# Patient Record
Sex: Male | Born: 1960 | Race: Black or African American | Hispanic: No | Marital: Single | Smoking: Current some day smoker
Health system: Southern US, Community
[De-identification: ages and names within clinical notes are randomized; demographics above are authoritative.]

## PROBLEM LIST (undated history)

## (undated) DIAGNOSIS — M1990 Unspecified osteoarthritis, unspecified site: Secondary | ICD-10-CM

## (undated) HISTORY — PX: WRIST SURGERY: SHX841

## (undated) HISTORY — PX: OTHER SURGICAL HISTORY: SHX169

---

## 2000-11-24 ENCOUNTER — Emergency Department (HOSPITAL_COMMUNITY): Admission: EM | Admit: 2000-11-24 | Discharge: 2000-11-24 | Payer: Self-pay | Admitting: Emergency Medicine

## 2005-04-06 ENCOUNTER — Emergency Department (HOSPITAL_COMMUNITY): Admission: EM | Admit: 2005-04-06 | Discharge: 2005-04-06 | Payer: Self-pay | Admitting: Emergency Medicine

## 2008-04-30 ENCOUNTER — Emergency Department (HOSPITAL_COMMUNITY): Admission: EM | Admit: 2008-04-30 | Discharge: 2008-04-30 | Payer: Self-pay | Admitting: Emergency Medicine

## 2008-05-02 ENCOUNTER — Emergency Department (HOSPITAL_COMMUNITY): Admission: EM | Admit: 2008-05-02 | Discharge: 2008-05-02 | Payer: Self-pay | Admitting: Emergency Medicine

## 2008-05-04 ENCOUNTER — Emergency Department (HOSPITAL_COMMUNITY): Admission: EM | Admit: 2008-05-04 | Discharge: 2008-05-04 | Payer: Self-pay | Admitting: Emergency Medicine

## 2008-06-08 ENCOUNTER — Emergency Department (HOSPITAL_COMMUNITY): Admission: EM | Admit: 2008-06-08 | Discharge: 2008-06-08 | Payer: Self-pay | Admitting: Emergency Medicine

## 2009-03-19 ENCOUNTER — Emergency Department (HOSPITAL_COMMUNITY): Admission: EM | Admit: 2009-03-19 | Discharge: 2009-03-19 | Payer: Self-pay | Admitting: Emergency Medicine

## 2010-04-15 ENCOUNTER — Emergency Department (HOSPITAL_COMMUNITY)
Admission: EM | Admit: 2010-04-15 | Discharge: 2010-04-16 | Payer: Self-pay | Source: Home / Self Care | Admitting: Emergency Medicine

## 2011-08-17 ENCOUNTER — Emergency Department (HOSPITAL_COMMUNITY)
Admission: EM | Admit: 2011-08-17 | Discharge: 2011-08-17 | Disposition: A | Discharge status: Home / Self Care | Payer: No Typology Code available for payment source | Attending: Emergency Medicine | Admitting: Emergency Medicine

## 2011-08-17 ENCOUNTER — Encounter (HOSPITAL_COMMUNITY): Payer: Self-pay | Admitting: *Deleted

## 2011-08-17 DIAGNOSIS — M542 Cervicalgia: Secondary | ICD-10-CM | POA: Insufficient documentation

## 2011-08-17 DIAGNOSIS — M549 Dorsalgia, unspecified: Secondary | ICD-10-CM | POA: Insufficient documentation

## 2011-08-17 DIAGNOSIS — Y92410 Unspecified street and highway as the place of occurrence of the external cause: Secondary | ICD-10-CM | POA: Insufficient documentation

## 2011-08-17 MED ORDER — IBUPROFEN 600 MG PO TABS: 600.0000 mg | ORAL_TABLET | Freq: Four times a day (QID) | ORAL | Status: AC | PRN

## 2011-08-17 MED ORDER — DIAZEPAM 5 MG PO TABS: 5.0000 mg | ORAL_TABLET | Freq: Three times a day (TID) | ORAL | Status: AC | PRN
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
## 2011-08-17 NOTE — ED Provider Notes (Signed)
Medical screening examination/treatment/procedure(s) were performed by non-physician practitioner and as supervising physician I was immediately available for consultation/collaboration. Devoria Albe, MD, Armando Gang   Ward Givens, MD 08/17/11 [phone]
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
## 2011-08-31 ENCOUNTER — Emergency Department (HOSPITAL_COMMUNITY)
Admission: EM | Admit: 2011-08-31 | Discharge: 2011-08-31 | Disposition: A | Discharge status: Home / Self Care | Payer: Self-pay | Attending: Emergency Medicine | Admitting: Emergency Medicine

## 2011-08-31 ENCOUNTER — Other Ambulatory Visit: Payer: Self-pay

## 2011-08-31 ENCOUNTER — Encounter (HOSPITAL_COMMUNITY): Payer: Self-pay | Admitting: *Deleted

## 2011-08-31 DIAGNOSIS — R42 Dizziness and giddiness: Secondary | ICD-10-CM | POA: Insufficient documentation

## 2011-08-31 DIAGNOSIS — F1410 Cocaine abuse, uncomplicated: Secondary | ICD-10-CM | POA: Insufficient documentation

## 2011-08-31 DIAGNOSIS — R011 Cardiac murmur, unspecified: Secondary | ICD-10-CM | POA: Insufficient documentation

## 2011-08-31 DIAGNOSIS — F17200 Nicotine dependence, unspecified, uncomplicated: Secondary | ICD-10-CM | POA: Insufficient documentation

## 2011-08-31 LAB — DIFFERENTIAL
Basophils Absolute: 0 10*3/uL (ref 0.0–0.1)
Basophils Relative: 0 % (ref 0–1)
Eosinophils Absolute: 0.4 10*3/uL (ref 0.0–0.7)
Eosinophils Relative: 2 % (ref 0–5)
Lymphs Abs: 1.3 10*3/uL (ref 0.7–4.0)
Monocytes Relative: 5 % (ref 3–12)
Neutro Abs: 13.7 10*3/uL — ABNORMAL HIGH (ref 1.7–7.7)
Neutrophils Relative %: 85 % — ABNORMAL HIGH (ref 43–77)

## 2011-08-31 LAB — BASIC METABOLIC PANEL
BUN: 17 mg/dL (ref 6–23)
CO2: 22 mEq/L (ref 19–32)
Calcium: 9.3 mg/dL (ref 8.4–10.5)
Chloride: 100 mEq/L (ref 96–112)
GFR calc Af Amer: 71 mL/min — ABNORMAL LOW (ref >90)
GFR calc non Af Amer: 61 mL/min — ABNORMAL LOW (ref >90)
Glucose, Bld: 101 mg/dL — ABNORMAL HIGH (ref 70–99)
Potassium: 3.8 mEq/L (ref 3.5–5.1)
Sodium: 136 mEq/L (ref 135–145)

## 2011-08-31 LAB — URINALYSIS, ROUTINE W REFLEX MICROSCOPIC
Bilirubin Urine: NEGATIVE
Ketones, ur: NEGATIVE mg/dL
Leukocytes, UA: NEGATIVE
Nitrite: NEGATIVE
Specific Gravity, Urine: >1.03 — ABNORMAL HIGH (ref 1.005–1.030)
Urobilinogen, UA: 0.2 mg/dL (ref 0.0–1.0)
pH: 6 (ref 5.0–8.0)

## 2011-08-31 LAB — RAPID URINE DRUG SCREEN, HOSP PERFORMED
Barbiturates: NOT DETECTED
Benzodiazepines: POSITIVE — AB
Cocaine: POSITIVE — AB
Opiates: NOT DETECTED
Tetrahydrocannabinol: NOT DETECTED

## 2011-08-31 LAB — CBC
Hemoglobin: 14.4 g/dL (ref 13.0–17.0)
MCH: 32.3 pg (ref 26.0–34.0)
MCHC: 35.6 g/dL (ref 30.0–36.0)
Platelets: 298 10*3/uL (ref 150–400)
RBC: 4.46 MIL/uL (ref 4.22–5.81)
RDW: 13.3 % (ref 11.5–15.5)

## 2011-08-31 LAB — URINE MICROSCOPIC-ADD ON

## 2011-08-31 LAB — ETHANOL: Alcohol, Ethyl (B): <11 mg/dL (ref 0–11)

## 2011-08-31 NOTE — ED Notes (Signed)
Discharge instructions reviewed with pt, questions answered. Pt verbalized understanding.  
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
## 2011-08-31 NOTE — Discharge Instructions (Signed)
Avoid using cocaine. Get plenty of rest, drink a lot of fluids, and find a primary care Dr. to see for followup care. Use the list of treatment programs given to you tonight to help find a place to get help for your cocaine abuse. Return here if needed for problems.  RESOURCE GUIDE  Dental Problems  Patients with Medicaid: [facility] [phone] [address]                                           [phone] [employer] Phone:  [phone]                                                  Phone:  [phone]  If unable to pay or uninsured, contact:  [facility] or [facility]. to become qualified for the adult dental clinic.  Chronic Pain Problems Contact [facility]  [phone] Patients need to be referred by their primary care doctor.  Insufficient Money for Medicine Contact United Way:  call "211" or [facility] 910-304-9115.  No Primary Care Doctor Call [facility]  309-405-1567 Other agencies that provide inexpensive medical care    [facility]  [phone]    [facility]  [phone]    [facility]  [phone]    [facility]  [phone]    [facility]  [phone]    [facility] Clinic  [phone]  Psychological Services [facility] Behavioral Health  [phone] [facility] Services  [phone] [facility] Mental Health   [phone] (emergency services [phone])  Substance Abuse Resources Alcohol and Drug Services  309-279-5939 [facility] [phone] [facility] [phone] Floydene Flock [phone] Residential & Outpatient Substance Abuse Program  (425) [phone]  Abuse/Neglect [facility] Child Abuse Hotline (848) 700-2381 [facility] Child Abuse Hotline 313-214-2224 (After Hours)  Emergency Shelter [facility] [phone]  [facility] [place] [facility] 661-055-8159 [facility] [phone]  MRSA Hotline #:   684-521-8391    [facility] Resources  [facility] [place]     United Way                          [facility] [address] [place]                       [address]      [address]                                                  Cristobal Goldmann Phone:  [phone]  Phone:  [phone]                 Phone:  [phone]  [facility] Mental Health Phone:  [phone]  [facility] Child Abuse Hotline [phone] 9348291790 (After Hours) Cocaine Abuse PROBLEMS FROM USING COCAINE:   Highly addictive.   Illegal.   Risk of sudden death.   Heart disease.   Irregular heart beat.   High blood pressure.   Damage to nose and lungs.   Severe agitation.   Hallucinations.   Violent behavior.   Paranoia.   Sexual dysfunction.  Most cocaine users deny that they have a problem with addiction. The biggest problem is admitting that you are dependent on cocaine. Those trying to quit using it may experience depression and withdrawal symptoms. Other withdrawal symptoms include fatigue, suicidal thoughts, sleepiness, restlessness, anxiety, and increased craving for cocaine. There are medications available to help prevent depression associated with stopping cocaine. Most users will find a support group or treatment program helpful in coming off and staying off cocaine. The best chance to cure cocaine addiction is to go into group therapy and to be in a drug-free environment. It is very important to develop healthy relationships and avoid socializing with people who use or deal drugs. Eat well, and give your body the proper rest and healthy exercise it needs. You may need medication to help treat withdrawal symptoms. Call your caregiver or a drug treatment center for more help.  You may also want to call  the [facility] on Drug Abuse at 800-662-HELP in the [place] SEEK IMMEDIATE MEDICAL CARE IF:  You develop severe chest pain.   You develop shortness of breath.   You develop extreme agitation.  Document Released: 07/21/2004 Document Revised: 06/02/2011 Document Reviewed: 04/15/2009 [facility] Patient Information 2012 [place], [place].Heart Murmur A heart murmur is an extra sound heard by your caregiver when listening to your heart with a device called a stethoscope. The sound might be a "hum" or "whoosh" sound heard when the heart beats. The sound comes from turbulence when blood flows through the heart. There are two types of heart murmurs:  Innocent (Harmless) murmurs: Most people with this type of heart murmur do not have signs or symptoms of heart problems. Many children have innocent heart murmurs. When an innocent heart murmur is found, there is no need to get tests or do treatment. Also, there is no need to restrict activities or stop playing sports. Innocent heart murmurs may be caused by many things. For example, it might be caused by a tiny hole or defect in the wall of the heart. These defects often close as a child grows. An innocent heart murmur may be heard by an examining clinician throughout your life. If you see a new caregiver, please let him or her know this was found during past exams.   Abnormal murmurs: May have signs and symptoms of heart problems. These types of murmurs can occur in children and adults. In children, abnormal heart murmurs are typically caused from heart defects that are present at birth. In adults, abnormal murmurs are usually from heart valve problems caused by disease, infection, or aging.  SYMPTOMS   Innocent (Harmless) murmurs do not cause symptoms or require you to limit physical activity.   Many people with abnormal murmurs may or may not have symptoms. If symptoms do develop, they might include:   Shortness of breath.   Blue coloring of the  skin, especially on the fingertips.  Chest pain.   Palpitations or feeling a "fluttering" or a "skipped" heart beat.   Fainting.   Persistent cough.   Getting tired much faster than expected.  DIAGNOSIS  A heart murmur might be heard during a pre-sports physical or during any type of examination. When a murmur is heard, it may suggest a possible problem. When this happens, your caregiver may ask you to see a heart specialist (cardiologist). You may also be asked to undergo one or more heart tests. In these cases, testing may vary depending upon what your caregiver heard. Tests for a heart murmur might include one or more of the following:  EKG (electrocardiogram).   Echocardiogram.   Cardiac MRI.  For children and adults who have an abnormal heart murmur and want to play sports, it is important to complete testing, review test results, and receive recommendations from your caregiver. If heart disease is present, it may be risky to play. Finding out the results of your test Not all test results are available during your visit. If your test results are not back during the visit, make an appointment with your caregiver to find out the results. Do not assume everything is normal if you have not heard from your caregiver or the medical facility. It is important for you to follow up on all of your test results.  TREATMENT  As noted above, innocent (harmless) murmurs require no treatment or activity restriction. If the murmur represents a problem with the heart, treatment will depend upon the exact nature of the problem. In these cases, medicine or surgery may be needed to treat the problem. HOME CARE INSTRUCTIONS If you want to participate in sports or other types of strenuous physical activity, it is important to discuss this first with your caregiver. If the murmur represents a problem with the heart and you choose to participate in sports, there is a small chance that a serious problem  (including sudden death) could result.  SEEK MEDICAL CARE IF:   You feel that your symptoms are slowly worsening.   You develop any new symptoms that cause concern.   You feel that you are having side effects from any medicines prescribed.  SEEK IMMEDIATE MEDICAL CARE IF:   Chest pain develops.   You are short of breath.   You notice that your heart beats irregularly often enough to cause you to worry.   You have fainting spells.   There is a worsening of any problems that brought you or your child in for medical care.  Document Released: 07/21/2004 Document Revised: 06/02/2011 Document Reviewed: 08/21/2007 [facility] Patient Information 2012 [place], [place].
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
## 2011-08-31 NOTE — ED Notes (Addendum)
Pt belongings bagged, labeled & placed in ems equipment room locker

## 2011-08-31 NOTE — ED Notes (Signed)
Pt's belongings returned, pt verified contents. Pt transported home by [employer] officer.

## 2012-01-10 ENCOUNTER — Encounter (HOSPITAL_COMMUNITY): Payer: Self-pay

## 2012-01-10 ENCOUNTER — Emergency Department (HOSPITAL_COMMUNITY)
Admission: EM | Admit: 2012-01-10 | Discharge: 2012-01-10 | Disposition: A | Discharge status: Home / Self Care | Payer: Self-pay | Attending: Emergency Medicine | Admitting: Emergency Medicine

## 2012-01-10 ENCOUNTER — Emergency Department (HOSPITAL_COMMUNITY): Payer: Self-pay

## 2012-01-10 DIAGNOSIS — M25569 Pain in unspecified knee: Secondary | ICD-10-CM | POA: Insufficient documentation

## 2012-01-10 DIAGNOSIS — M25519 Pain in unspecified shoulder: Secondary | ICD-10-CM | POA: Insufficient documentation

## 2012-01-10 MED ORDER — TRAMADOL HCL 50 MG PO TABS: 50.0000 mg | ORAL_TABLET | Freq: Four times a day (QID) | ORAL | Status: AC | PRN

## 2012-01-10 MED ORDER — HYDROCODONE-ACETAMINOPHEN 5-325 MG PO TABS
ORAL_TABLET | ORAL | Status: AC
  Administered 2012-01-10: 1 via ORAL
  Filled 2012-01-10: qty 1

## 2012-01-10 MED ORDER — NAPROXEN 500 MG PO TABS: 500.0000 mg | ORAL_TABLET | Freq: Two times a day (BID) | ORAL | Status: DC

## 2012-01-10 MED ORDER — HYDROCODONE-ACETAMINOPHEN 5-325 MG PO TABS
1.0000 | ORAL_TABLET | Freq: Once | ORAL | Status: AC
  Administered 2012-01-10: 1 via ORAL

## 2012-01-10 NOTE — ED Notes (Signed)
Pt c/o pain in r shoulder and r elbow x 3 days.  Denies injury.  Pain is worse with movement.  Also reports for the past 2 weeks R knee has been hurting and "pops" when bends leg.  Says knee has been "giving out."
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
## 2012-01-10 NOTE — ED Notes (Signed)
Patient with no complaints at this time. Respirations even and unlabored. Skin warm/dry. Discharge instructions reviewed with patient at this time. Patient given opportunity to voice concerns/ask questions. Patient discharged at this time and left Emergency Department with steady gait.   

## 2012-01-10 NOTE — Progress Notes (Signed)
Requested medication assistance with pain medication. Notified ER that the Medication Indigent fund ended 12/25/11 and was for medication such as antibiotics. This medication would have to be approved by their Department Director.

## 2012-01-10 NOTE — ED Provider Notes (Signed)
History    This chart was scribed for George Lennert, MD, MD by George Beltran. The patient was seen in room APFT20 and the patient's care was started at 12:56PM.   CSN: [id number]  Arrival date & time 01/10/12  1157   First MD Initiated Contact with Patient 01/10/12 1253      Chief Complaint  Patient presents with  . Shoulder Pain  . Elbow Pain  . Knee Pain    (Consider location/radiation/quality/duration/timing/severity/associated sxs/prior treatment) Patient is a 51 y.o. male presenting with shoulder pain and knee pain. The history is provided by the patient.  Shoulder Pain This is a new problem. The current episode started more than 2 days ago. The problem occurs constantly. The problem has not changed since onset. Knee Pain This is a new problem. The current episode started more than 2 days ago. The problem occurs constantly. The problem has not changed since onset.The symptoms are aggravated by walking.   George Beltran is a 51 y.o. male who presents to the Emergency Department complaining of moderate right shoulder, right elbow and right knee pain onset 3 days ago. Pt reports that symptoms have been constant since onset without radiation. The pain has been waking the patient up . Pain in shoulder is aggravated by movement. He reports movement of his leg aggravates the right knee pain.   History reviewed. No pertinent past medical history.  Past Surgical History  Procedure Date  . Wrist surgery     No family history on file.  History  Substance Use Topics  . Smoking status: Current Some Day Smoker -- 0.5 packs/day    Types: Cigarettes  . Smokeless tobacco: Not on file  . Alcohol Use: Yes     occ      Review of Systems  All other systems reviewed and are negative.   10 Systems reviewed and all are negative for acute change except as noted in the HPI.   Allergies  Review of patient's allergies indicates no known allergies.  Home Medications   Current  Outpatient Rx  Name Route Sig Dispense Refill  . B COMPLEX-C PO TABS Oral Take 1 tablet by mouth daily.    Marland Kitchen DIAZEPAM 5 MG PO TABS Oral Take 5 mg by mouth every 6 (six) hours as needed.    . ADULT MULTIVITAMIN W/MINERALS CH Oral Take 1 tablet by mouth daily.    Marland Kitchen PREDNISONE 10 MG PO TABS Oral Take 10 mg by mouth daily.      BP 109/91  Pulse 65  Temp 97.9 F (36.6 C) (Oral)  Resp 18  Ht 5\' 10"  (1.778 m)  Wt 180 lb (81.647 kg)  BMI 25.83 kg/m2  SpO2 100%  Physical Exam  Nursing note and vitals reviewed. Constitutional: He is oriented to person, place, and time. He appears well-developed.  HENT:  Head: Normocephalic and atraumatic.  Eyes: Conjunctivae and EOM are normal. No scleral icterus.  Neck: Neck supple. No thyromegaly present.  Cardiovascular: Normal rate and regular rhythm.  Exam reveals no gallop and no friction rub.   No murmur heard. Pulmonary/Chest: No stridor. He has no wheezes. He has no rales. He exhibits no tenderness.  Abdominal: He exhibits no distension. There is no tenderness. There is no rebound.  Musculoskeletal: Normal range of motion. He exhibits no edema.       Anterior right shoulder tenderness with pain with extension.  Left lateral knee tenderness. Full ROM of left knee.   Lymphadenopathy:  He has no cervical adenopathy.  Neurological: He is oriented to person, place, and time. Coordination normal.  Skin: No rash noted. No erythema.  Psychiatric: He has a normal mood and affect. His behavior is normal.    ED Course  Procedures (including critical care time) DIAGNOSTIC STUDIES: Oxygen Saturation is 100% on room air, normal by my interpretation.    COORDINATION OF CARE: 1:00PM EDP discusses pt ED treatment with pt     Labs Reviewed - No data to display Dg Shoulder Right  01/10/2012  *RADIOLOGY REPORT*  Clinical Data: Right shoulder pain since a fall 1 week ago.  RIGHT SHOULDER - 2+ VIEW  Comparison: None.  Findings: There is no fracture,  dislocation, or other acute osseous abnormality.  Slight degenerative spurring on the humeral head.  IMPRESSION: No acute abnormalities.  Original Report Authenticated By: George Beltran, M.D.     No diagnosis found.    MDM  The chart was scribed for me under my direct supervision.  I personally performed the history, physical, and medical decision making and all procedures in the evaluation of this patient.George Lennert, MD 01/10/12 [phone]

## 2012-01-17 ENCOUNTER — Emergency Department (HOSPITAL_COMMUNITY)
Admission: EM | Admit: 2012-01-17 | Discharge: 2012-01-17 | Disposition: A | Discharge status: Home / Self Care | Payer: Self-pay | Attending: Emergency Medicine | Admitting: Emergency Medicine

## 2012-01-17 ENCOUNTER — Encounter (HOSPITAL_COMMUNITY): Payer: Self-pay

## 2012-01-17 DIAGNOSIS — F17200 Nicotine dependence, unspecified, uncomplicated: Secondary | ICD-10-CM | POA: Insufficient documentation

## 2012-01-17 DIAGNOSIS — M25511 Pain in right shoulder: Secondary | ICD-10-CM

## 2012-01-17 DIAGNOSIS — M7710 Lateral epicondylitis, unspecified elbow: Secondary | ICD-10-CM

## 2012-01-17 DIAGNOSIS — M25519 Pain in unspecified shoulder: Secondary | ICD-10-CM | POA: Insufficient documentation

## 2012-01-17 MED ORDER — HYDROCODONE-ACETAMINOPHEN 5-325 MG PO TABS: 1.0000 | ORAL_TABLET | Freq: Four times a day (QID) | ORAL | Status: AC | PRN

## 2012-01-17 MED ORDER — IBUPROFEN 600 MG PO TABS: 600.0000 mg | ORAL_TABLET | Freq: Three times a day (TID) | ORAL | Status: AC | PRN

## 2012-01-17 NOTE — ED Provider Notes (Signed)
History   This chart was scribed for Lyanne Co, MD by Charolett Bumpers . The patient was seen in room APFT22/APFT22. Patient's care was started at 16:03.    CSN: [id number]  Arrival date & time 01/17/12  1538   First MD Initiated Contact with Patient 01/17/12 1603      Chief Complaint  Patient presents with  . Shoulder Pain     HPI George Beltran is a 51 y.o. male who presents to the Emergency Department complaining of constant, moderate right shoulder pain for the past 3-4 weeks. Pt states that he was seen here in ED with a normal x-ray and was told to f/u with an orthopedic surgeon. Pt states that he did f/u with Dr. Gurney Maxin and has an appointment in 2 weeks. Pt reports that his shoulder pain has slightly worsened but denies any new pain. Pt reports that the pain medication he was d/c with did provide relief, but ran out of the pain medication 2 days ago.    History reviewed. No pertinent past medical history.  Past Surgical History  Procedure Date  . Wrist surgery     No family history on file.  History  Substance Use Topics  . Smoking status: Current Some Day Smoker -- 0.5 packs/day    Types: Cigarettes  . Smokeless tobacco: Not on file  . Alcohol Use: Yes     occ      Review of Systems A complete 10 system review of systems was obtained and all systems are negative except as noted in the HPI and PMH.   Allergies  Review of patient's allergies indicates no known allergies.  Home Medications   Current Outpatient Rx  Name Route Sig Dispense Refill  . B COMPLEX-C PO TABS Oral Take 1 tablet by mouth daily.    Marland Kitchen DIAZEPAM 5 MG PO TABS Oral Take 5 mg by mouth every 6 (six) hours as needed.    . ADULT MULTIVITAMIN W/MINERALS CH Oral Take 1 tablet by mouth daily.    Marland Kitchen NAPROXEN 500 MG PO TABS Oral Take 1 tablet (500 mg total) by mouth 2 (two) times daily. 30 tablet 0  . PREDNISONE 10 MG PO TABS Oral Take 10 mg by mouth daily.    . TRAMADOL HCL 50 MG PO  TABS Oral Take 1 tablet (50 mg total) by mouth every 6 (six) hours as needed for pain. 30 tablet 0    BP 143/85  Pulse 67  Temp 97.9 F (36.6 C) (Oral)  Resp 18  Ht 5\' 10"  (1.778 m)  Wt 180 lb (81.647 kg)  BMI 25.83 kg/m2  SpO2 99%  Physical Exam  Nursing note and vitals reviewed. Constitutional: He is oriented to person, place, and time. He appears well-developed and well-nourished. No distress.  HENT:  Head: Normocephalic and atraumatic.  Eyes: EOM are normal. Pupils are equal, round, and reactive to light.  Neck: Normal range of motion. Neck supple. No tracheal deviation present.  Cardiovascular: Normal rate and intact distal pulses.        Normal right radial pulse.   Pulmonary/Chest: Effort normal. No respiratory distress.  Abdominal: Soft. He exhibits no distension.  Musculoskeletal: Normal range of motion. He exhibits tenderness. He exhibits no edema.       Tenderness of right radial head with no tenderness of right shoulder.   Neurological: He is alert and oriented to person, place, and time. No sensory deficit.       Strength 5/5  bilaterally of upper extremities.     Skin: Skin is warm and dry.  Psychiatric: He has a normal mood and affect. His behavior is normal.    ED Course  Procedures (including critical care time)  DIAGNOSTIC STUDIES: Oxygen Saturation is 99% on room air, normal by my interpretation.    COORDINATION OF CARE:  16:09-Discussed planned course of treatment with the patient, who is agreeable at this time.    I personally reviewed the imaging tests through PACS system  I reviewed available ER/hospitalization records thought the EMR   Labs Reviewed - No data to display No results found.   No diagnosis found.    MDM  I suspect this is right lateral epicondylitis.  He is tenderness over his right radial head.  Patient be treated with anti-inflammatory pain medicine and a short course of Vicodin.  Pincus Badder he has followup with an orthopedic  surgeon on 02/02/2012.  There is no indication for repeat imaging.  He has no weakness in his right hand.  Normal right radial pulse.  His compartments are soft.  I personally performed the services described in this documentation, which was scribed in my presence. The recorded information has been reviewed and considered.          Lyanne Co, MD 01/17/12 [phone]

## 2012-01-17 NOTE — ED Notes (Signed)
Pt reports has had R shoulder pain for the past 3 or 4 weeks.  Reports was evaluated here last week and given pain medication.  Pt ran out of meds 2 days ago.

## 2012-01-17 NOTE — ED Notes (Signed)
Pt c/o rt shoulder pain x 3 weeks that has gotten gradually worse over time. Pt denies any injury.

## 2012-02-12 ENCOUNTER — Emergency Department (HOSPITAL_COMMUNITY)
Admission: EM | Admit: 2012-02-12 | Discharge: 2012-02-12 | Disposition: A | Discharge status: Home / Self Care | Payer: Self-pay | Attending: Emergency Medicine | Admitting: Emergency Medicine

## 2012-02-12 ENCOUNTER — Encounter (HOSPITAL_COMMUNITY): Payer: Self-pay | Admitting: *Deleted

## 2012-02-12 DIAGNOSIS — R52 Pain, unspecified: Secondary | ICD-10-CM | POA: Insufficient documentation

## 2012-02-12 DIAGNOSIS — M2550 Pain in unspecified joint: Secondary | ICD-10-CM | POA: Insufficient documentation

## 2012-02-12 DIAGNOSIS — F17200 Nicotine dependence, unspecified, uncomplicated: Secondary | ICD-10-CM | POA: Insufficient documentation

## 2012-02-12 DIAGNOSIS — G8929 Other chronic pain: Secondary | ICD-10-CM | POA: Insufficient documentation

## 2012-02-12 MED ORDER — HYDROCODONE-ACETAMINOPHEN 5-325 MG PO TABS
2.0000 | ORAL_TABLET | Freq: Once | ORAL | Status: AC
  Administered 2012-02-12: 2 via ORAL
  Filled 2012-02-12: qty 2

## 2012-02-12 MED ORDER — HYDROCODONE-ACETAMINOPHEN 5-325 MG PO TABS: ORAL_TABLET | ORAL | Status: DC

## 2012-02-12 MED ORDER — DEXAMETHASONE 4 MG PO TABS: ORAL_TABLET | ORAL | Status: AC

## 2012-02-12 MED ORDER — DEXAMETHASONE SODIUM PHOSPHATE 4 MG/ML IJ SOLN
8.0000 mg | Freq: Once | INTRAMUSCULAR | Status: AC
  Administered 2012-02-12: 8 mg via INTRAMUSCULAR
  Filled 2012-02-12: qty 2

## 2012-02-12 MED ORDER — ONDANSETRON HCL 4 MG PO TABS
4.0000 mg | ORAL_TABLET | Freq: Once | ORAL | Status: AC
  Administered 2012-02-12: 4 mg via ORAL
  Filled 2012-02-12: qty 1

## 2012-02-12 NOTE — ED Provider Notes (Signed)
Medical screening examination/treatment/procedure(s) were performed by non-physician practitioner and as supervising physician I was immediately available for consultation/collaboration. Devoria Albe, MD, FACEPWard Givens, MD 02/12/12 [phone]

## 2012-02-12 NOTE — ED Notes (Signed)
Pt c/o pain in his left arm for 25 years, right arm for 2 years and right knee for 2 years. States that he has constant pain every day. Right knee swollen on top.

## 2012-02-12 NOTE — ED Provider Notes (Signed)
History     CSN: [id number]  Arrival date & time 02/12/12  1015   First MD Initiated Contact with Patient 02/12/12 1029      Chief Complaint  Patient presents with  . Arm Pain    (Consider location/radiation/quality/duration/timing/severity/associated sxs/prior treatment) Patient is a 51 y.o. male presenting with arm pain. The history is provided by the patient.  Arm Pain This is a chronic problem. The current episode started more than 1 year ago. The problem occurs daily. The problem has been gradually worsening. Associated symptoms include arthralgias and joint swelling. Pertinent negatives include no abdominal pain, chest pain, coughing, fever or neck pain. Exacerbated by: certain positions. He has tried nothing for the symptoms. The treatment provided no relief.    History reviewed. No pertinent past medical history.  Past Surgical History  Procedure Date  . Wrist surgery   . Arm surgery     left    History reviewed. No pertinent family history.  History  Substance Use Topics  . Smoking status: Current Some Day Smoker -- 0.5 packs/day    Types: Cigarettes  . Smokeless tobacco: Not on file  . Alcohol Use: Yes     occ      Review of Systems  Constitutional: Negative for fever and activity change.       All ROS Neg except as noted in HPI  HENT: Negative for nosebleeds and neck pain.   Eyes: Negative for photophobia and discharge.  Respiratory: Negative for cough, shortness of breath and wheezing.   Cardiovascular: Negative for chest pain and palpitations.  Gastrointestinal: Negative for abdominal pain and blood in stool.  Genitourinary: Negative for dysuria, frequency and hematuria.  Musculoskeletal: Positive for joint swelling and arthralgias. Negative for back pain.  Skin: Negative.   Neurological: Negative for dizziness, seizures and speech difficulty.  Psychiatric/Behavioral: Negative for hallucinations and confusion.    Allergies  Review of patient's  allergies indicates no known allergies.  Home Medications   Current Outpatient Rx  Name Route Sig Dispense Refill  . B COMPLEX-C PO TABS Oral Take 1 tablet by mouth daily.    Marland Kitchen DIAZEPAM 5 MG PO TABS Oral Take 5 mg by mouth every 6 (six) hours as needed.    . ADULT MULTIVITAMIN W/MINERALS CH Oral Take 1 tablet by mouth daily.    Marland Kitchen NAPROXEN 500 MG PO TABS Oral Take 1 tablet (500 mg total) by mouth 2 (two) times daily. 30 tablet 0  . PREDNISONE 10 MG PO TABS Oral Take 10 mg by mouth daily.      BP 133/86  Pulse 72  Temp 98.3 F (36.8 C) (Oral)  Resp 16  Ht 5\' 10"  (1.778 m)  Wt 180 lb (81.647 kg)  BMI 25.83 kg/m2  SpO2 100%  Physical Exam  Nursing note and vitals reviewed. Constitutional: He is oriented to person, place, and time. He appears well-developed and well-nourished.  Non-toxic appearance.  HENT:  Head: Normocephalic.  Right Ear: Tympanic membrane and external ear normal.  Left Ear: Tympanic membrane and external ear normal.  Eyes: EOM and lids are normal. Pupils are equal, round, and reactive to light.  Neck: Normal range of motion. Neck supple. Carotid bruit is not present.  Cardiovascular: Normal rate, regular rhythm, normal heart sounds, intact distal pulses and normal pulses.   Pulmonary/Chest: Breath sounds normal. No respiratory distress.  Abdominal: Soft. Bowel sounds are normal. There is no tenderness. There is no guarding.  Musculoskeletal: Normal range of motion.  There is crepitus with range of motion of both shoulders. There is bursa area tenderness on the right shoulder. There no hot joints appreciated. There's no dislocations appreciated. The elbows are well within normal limits. There is soreness with range of motion of the left wrist and fingers.  There is fair range of motion of both hips. There is significant crepitus with attempted range of motion of both knees. Right more than left. There is degenerative deformity of the anterior right knee. There  is fair range of motion of both ankles. The dorsalis pedis pulses are symmetrical.  Lymphadenopathy:       Head (right side): No submandibular adenopathy present.       Head (left side): No submandibular adenopathy present.    He has no cervical adenopathy.  Neurological: He is alert and oriented to person, place, and time. He has normal strength. No cranial nerve deficit or sensory deficit. He exhibits normal muscle tone. Coordination normal.  Skin: Skin is warm and dry.  Psychiatric: He has a normal mood and affect. His speech is normal.    ED Course  Procedures (including critical care time)  Labs Reviewed - No data to display No results found.   No diagnosis found.    MDM  I have reviewed nursing notes, vital signs, and all appropriate lab and imaging results for this patient. Patient presents with an acute flare of his chronic multiple area joint pain. No acute findings at this time. Patient is treated with dexamethasone daily and Norco every 4 hours. Patient advised to see M.D. At the health department to establish primary care physician and for continued management of his chronic pain.       Kathie Dike, [place] 02/12/12 1104

## 2012-03-14 ENCOUNTER — Encounter (HOSPITAL_COMMUNITY): Payer: Self-pay | Admitting: Emergency Medicine

## 2012-03-14 ENCOUNTER — Emergency Department (HOSPITAL_COMMUNITY)
Admission: EM | Admit: 2012-03-14 | Discharge: 2012-03-14 | Disposition: A | Discharge status: Home / Self Care | Payer: Self-pay | Attending: Emergency Medicine | Admitting: Emergency Medicine

## 2012-03-14 DIAGNOSIS — M25559 Pain in unspecified hip: Secondary | ICD-10-CM | POA: Insufficient documentation

## 2012-03-14 DIAGNOSIS — M25519 Pain in unspecified shoulder: Secondary | ICD-10-CM | POA: Insufficient documentation

## 2012-03-14 DIAGNOSIS — F17200 Nicotine dependence, unspecified, uncomplicated: Secondary | ICD-10-CM | POA: Insufficient documentation

## 2012-03-14 DIAGNOSIS — Z87828 Personal history of other (healed) physical injury and trauma: Secondary | ICD-10-CM | POA: Insufficient documentation

## 2012-03-14 DIAGNOSIS — G8929 Other chronic pain: Secondary | ICD-10-CM

## 2012-03-14 DIAGNOSIS — M542 Cervicalgia: Secondary | ICD-10-CM | POA: Insufficient documentation

## 2012-03-14 MED ORDER — TRAMADOL HCL 50 MG PO TABS
100.0000 mg | ORAL_TABLET | Freq: Once | ORAL | Status: AC
  Administered 2012-03-14: 100 mg via ORAL
  Filled 2012-03-14: qty 2

## 2012-03-14 MED ORDER — ONDANSETRON HCL 4 MG PO TABS
4.0000 mg | ORAL_TABLET | Freq: Once | ORAL | Status: AC
  Administered 2012-03-14: 4 mg via ORAL
  Filled 2012-03-14: qty 1

## 2012-03-14 MED ORDER — TRAMADOL HCL 50 MG PO TABS: 50.0000 mg | ORAL_TABLET | Freq: Four times a day (QID) | ORAL | Status: DC | PRN

## 2012-03-14 MED ORDER — DEXAMETHASONE SODIUM PHOSPHATE 4 MG/ML IJ SOLN
8.0000 mg | Freq: Once | INTRAMUSCULAR | Status: AC
  Administered 2012-03-14: 8 mg via INTRAMUSCULAR
  Filled 2012-03-14: qty 2

## 2012-03-14 MED ORDER — DEXAMETHASONE 4 MG PO TABS: ORAL_TABLET | ORAL | Status: DC

## 2012-03-14 NOTE — ED Notes (Signed)
Pt presents with chronic arthritic type pain x several months that has worsened over the past few days. Pain is c/o in shoulder, knee and lower back. Denies injury. Pt noted pacing in room.
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
## 2012-03-20 NOTE — ED Provider Notes (Signed)
Medical screening examination/treatment/procedure(s) were performed by non-physician practitioner and as supervising physician I was immediately available for consultation/collaboration.   [name], MD 03/20/12 [phone]

## 2013-09-02 ENCOUNTER — Emergency Department (HOSPITAL_COMMUNITY)
Admission: EM | Admit: 2013-09-02 | Discharge: 2013-09-02 | Disposition: A | Discharge status: Home / Self Care | Payer: No Typology Code available for payment source | Attending: Emergency Medicine | Admitting: Emergency Medicine

## 2013-09-02 ENCOUNTER — Encounter (HOSPITAL_COMMUNITY): Payer: Self-pay | Admitting: Emergency Medicine

## 2013-09-02 DIAGNOSIS — M129 Arthropathy, unspecified: Secondary | ICD-10-CM | POA: Insufficient documentation

## 2013-09-02 DIAGNOSIS — M549 Dorsalgia, unspecified: Secondary | ICD-10-CM

## 2013-09-02 DIAGNOSIS — M545 Low back pain, unspecified: Secondary | ICD-10-CM | POA: Insufficient documentation

## 2013-09-02 DIAGNOSIS — F17200 Nicotine dependence, unspecified, uncomplicated: Secondary | ICD-10-CM | POA: Insufficient documentation

## 2013-09-02 DIAGNOSIS — M25562 Pain in left knee: Secondary | ICD-10-CM

## 2013-09-02 DIAGNOSIS — M25569 Pain in unspecified knee: Secondary | ICD-10-CM | POA: Insufficient documentation

## 2013-09-02 HISTORY — DX: Unspecified osteoarthritis, unspecified site: M19.90

## 2013-09-02 MED ORDER — DIAZEPAM 5 MG PO TABS: 5.0000 mg | ORAL_TABLET | Freq: Three times a day (TID) | ORAL | Status: DC | PRN

## 2013-09-02 MED ORDER — KETOROLAC TROMETHAMINE 60 MG/2ML IM SOLN
60.0000 mg | Freq: Once | INTRAMUSCULAR | Status: AC
  Administered 2013-09-02: 60 mg via INTRAMUSCULAR
  Filled 2013-09-02: qty 2

## 2013-09-02 MED ORDER — HYDROCODONE-ACETAMINOPHEN 5-325 MG PO TABS
1.0000 | ORAL_TABLET | ORAL | Status: DC | PRN
Start: 2013-09-02 — End: 2013-12-24

## 2013-09-02 NOTE — Discharge Instructions (Signed)
Arthralgia °Your caregiver has diagnosed you as suffering from an arthralgia. Arthralgia means there is pain in a joint. This can come from many reasons including: °· Bruising the joint which causes soreness (inflammation) in the joint. °· Wear and tear on the joints which occur as we grow older (osteoarthritis). °· Overusing the joint. °· Various forms of arthritis. °· Infections of the joint. °Regardless of the cause of pain in your joint, most of these different pains respond to anti-inflammatory drugs and rest. The exception to this is when a joint is infected, and these cases are treated with antibiotics, if it is a bacterial infection. °HOME CARE INSTRUCTIONS  °· Rest the injured area for as long as directed by your caregiver. Then slowly start using the joint as directed by your caregiver and as the pain allows. Crutches as directed may be useful if the ankles, knees or hips are involved. If the knee was splinted or casted, continue use and care as directed. If an stretchy or elastic wrapping bandage has been applied today, it should be removed and re-applied every 3 to 4 hours. It should not be applied tightly, but firmly enough to keep swelling down. Watch toes and feet for swelling, bluish discoloration, coldness, numbness or excessive pain. If any of these problems (symptoms) occur, remove the ace bandage and re-apply more loosely. If these symptoms persist, contact your caregiver or return to this location. °· For the first 24 hours, keep the injured extremity elevated on pillows while lying down. °· Apply ice for 15-20 minutes to the sore joint every couple hours while awake for the first half day. Then 03-04 times per day for the first 48 hours. Put the ice in a plastic bag and place a towel between the bag of ice and your skin. °· Wear any splinting, casting, elastic bandage applications, or slings as instructed. °· Only take over-the-counter or prescription medicines for pain, discomfort, or fever as  directed by your caregiver. Do not use aspirin immediately after the injury unless instructed by your physician. Aspirin can cause increased bleeding and bruising of the tissues. °· If you were given crutches, continue to use them as instructed and do not resume weight bearing on the sore joint until instructed. °Persistent pain and inability to use the sore joint as directed for more than 2 to 3 days are warning signs indicating that you should see a caregiver for a follow-up visit as soon as possible. Initially, a hairline fracture (break in bone) may not be evident on X-rays. Persistent pain and swelling indicate that further evaluation, non-weight bearing or use of the joint (use of crutches or slings as instructed), or further X-rays are indicated. X-rays may sometimes not show a small fracture until a week or 10 days later. Make a follow-up appointment with your own caregiver or one to whom we have referred you. A radiologist (specialist in reading X-rays) may read your X-rays. Make sure you know how you are to obtain your X-ray results. Do not assume everything is normal if you do not hear from us. °SEEK MEDICAL CARE IF: °Bruising, swelling, or pain increases. °SEEK IMMEDIATE MEDICAL CARE IF:  °· Your fingers or toes are numb or blue. °· The pain is not responding to medications and continues to stay the same or get worse. °· The pain in your joint becomes severe. °· You develop a fever over 102° F (38.9° C). °· It becomes impossible to move or use the joint. °MAKE SURE YOU:  °·   Understand these instructions.  Will watch your condition.  Will get help right away if you are not doing well or get worse. Document Released: 06/13/2005 Document Revised: 09/05/2011 Document Reviewed: 01/30/2008 [facility] Patient Information 2014 [place]ExitCare, [place]LL[place].   Take medicines as prescribed Use ibuprofen for day time and while at work

## 2013-09-02 NOTE — ED Notes (Signed)
Pain lt lower back, for 2 days, goes down to knee, No know injury.

## 2013-09-02 NOTE — ED Provider Notes (Signed)
CSN: [id number]     Arrival date & time 09/02/13  1416 History   First MD Initiated Contact with Patient 09/02/13 1527     Chief Complaint  Patient presents with  . Back Pain     (Consider location/radiation/quality/duration/timing/severity/associated sxs/prior Treatment) Patient is a 53 y.o. male presenting with back pain. The history is provided by the patient. No language interpreter was used.  Back Pain Location:  Lumbar spine Quality:  Aching Radiates to:  L posterior upper leg Associated symptoms: no abdominal pain    Pt is a 53 year old male who presents with back pain. He reports that he has been having lower back pain radiating into his left leg for a couple days. He denies any fever, chills, abdominal pain or dysuria. He describes this as a burning sensation from his left lower back into his left buttock and down his leg. No numbness or tingling or difficulty walking.   Past Medical History  Diagnosis Date  . Arthritis    Past Surgical History  Procedure Laterality Date  . Wrist surgery    . Arm surgery      left   No family history on file. History  Substance Use Topics  . Smoking status: Current Some Day Smoker -- 0.50 packs/day    Types: Cigarettes  . Smokeless tobacco: Not on file  . Alcohol Use: Yes     Comment: occ    Review of Systems  Gastrointestinal: Negative for abdominal pain.  Musculoskeletal: Positive for back pain. Negative for gait problem and joint swelling.      Allergies  Review of patient's allergies indicates no known allergies.  Home Medications   Current Outpatient Rx  Name  Route  Sig  Dispense  Refill  . acetaminophen (TYLENOL) 500 MG tablet   Oral   Take 1,000 mg by mouth every 6 (six) hours as needed. Pain         . Aspirin-Caffeine (BC FAST PAIN RELIEF ARTHRITIS) 1000-65 MG PACK   Oral   Take 1 packet by mouth every 8 (eight) hours as needed. Pain         . ibuprofen (ADVIL,MOTRIN) 200 MG tablet   Oral   Take 600  mg by mouth every 6 (six) hours as needed for moderate pain.          BP 134/90  Pulse 71  Temp(Src) 97.6 F (36.4 C) (Oral)  Resp 20  Ht 5\' 10"  (1.778 m)  Wt 175 lb (79.379 kg)  BMI 25.11 kg/m2  SpO2 99% Physical Exam  Nursing note and vitals reviewed. Constitutional: He is oriented to person, place, and time. He appears well-developed and well-nourished. No distress.  Well-appearing  HENT:  Head: Normocephalic and atraumatic.  Eyes: Conjunctivae and EOM are normal.  Neck: Normal range of motion. Neck supple. No JVD present. No tracheal deviation present. No thyromegaly present.  Cardiovascular: Normal rate, regular rhythm and normal heart sounds.   Pulmonary/Chest: Effort normal and breath sounds normal. No respiratory distress. He has no wheezes.  Abdominal: Soft. Bowel sounds are normal. He exhibits no distension. There is no tenderness.  Musculoskeletal: Normal range of motion.  Left lower, lumbar region with paravertebral tenderness extending into his left buttock. No midline spinal tenderness, focal deficits or weakness. No numbness or tingling. Good strength, sensation and coordination.  Lymphadenopathy:    He has no cervical adenopathy.  Neurological: He is alert and oriented to person, place, and time.  Skin: Skin is warm and  dry. No rash noted.  Psychiatric: He has a normal mood and affect. His behavior is normal. Judgment and thought content normal.    ED Course  Procedures (including critical care time) Labs Review Labs Reviewed - No data to display Imaging Review No results found.   EKG Interpretation None      MDM   Final diagnoses:  Back pain  Knee pain, left   Left, lower lumbar pain extending into left buttock. May be sciatic pain. Some relief with Toradol injection here. Prescriptions for valium and hydrocodone given and plan discussed with pt and he agrees.       Irish Elders, NP 09/04/13 [phone]

## 2013-09-02 NOTE — ED Notes (Signed)
Left lower back pain with pain radiating down left leg. Symptoms began yesterday.

## 2013-09-04 ENCOUNTER — Emergency Department (HOSPITAL_COMMUNITY)
Admission: EM | Admit: 2013-09-04 | Discharge: 2013-09-04 | Disposition: A | Discharge status: Home / Self Care | Payer: Self-pay | Attending: Emergency Medicine | Admitting: Emergency Medicine

## 2013-09-04 ENCOUNTER — Emergency Department (HOSPITAL_COMMUNITY): Payer: Self-pay

## 2013-09-04 ENCOUNTER — Encounter (HOSPITAL_COMMUNITY): Payer: Self-pay | Admitting: Emergency Medicine

## 2013-09-04 DIAGNOSIS — M5432 Sciatica, left side: Secondary | ICD-10-CM

## 2013-09-04 DIAGNOSIS — Y9289 Other specified places as the place of occurrence of the external cause: Secondary | ICD-10-CM | POA: Insufficient documentation

## 2013-09-04 DIAGNOSIS — Y9389 Activity, other specified: Secondary | ICD-10-CM | POA: Insufficient documentation

## 2013-09-04 DIAGNOSIS — IMO0002 Reserved for concepts with insufficient information to code with codable children: Secondary | ICD-10-CM | POA: Insufficient documentation

## 2013-09-04 DIAGNOSIS — F17200 Nicotine dependence, unspecified, uncomplicated: Secondary | ICD-10-CM | POA: Insufficient documentation

## 2013-09-04 DIAGNOSIS — M129 Arthropathy, unspecified: Secondary | ICD-10-CM | POA: Insufficient documentation

## 2013-09-04 DIAGNOSIS — Z79899 Other long term (current) drug therapy: Secondary | ICD-10-CM | POA: Insufficient documentation

## 2013-09-04 DIAGNOSIS — X500XXA Overexertion from strenuous movement or load, initial encounter: Secondary | ICD-10-CM | POA: Insufficient documentation

## 2013-09-04 MED ORDER — PREDNISONE 10 MG PO TABS: ORAL_TABLET | ORAL | Status: DC

## 2013-09-04 MED ORDER — HYDROCODONE-ACETAMINOPHEN 5-325 MG PO TABS
2.0000 | ORAL_TABLET | Freq: Once | ORAL | Status: AC
  Administered 2013-09-04: 2 via ORAL
  Filled 2013-09-04: qty 2

## 2013-09-04 MED ORDER — OXYCODONE-ACETAMINOPHEN 5-325 MG PO TABS: 1.0000 | ORAL_TABLET | ORAL | Status: DC | PRN

## 2013-09-04 MED ORDER — CYCLOBENZAPRINE HCL 10 MG PO TABS: 10.0000 mg | ORAL_TABLET | Freq: Three times a day (TID) | ORAL | Status: DC | PRN

## 2013-09-04 NOTE — Discharge Instructions (Signed)
Back Pain, Adult Back pain is very common. The pain often gets better over time. The cause of back pain is usually not dangerous. Most people can learn to manage their back pain on their own.  HOME CARE   Stay active. Start with short walks on flat ground if you can. Try to walk farther each day.  Do not sit, drive, or stand in one place for more than 30 minutes. Do not stay in bed.  Do not avoid exercise or work. Activity can help your back heal faster.  Be careful when you bend or lift an object. Bend at your knees, keep the object close to you, and do not twist.  Sleep on a firm mattress. Lie on your side, and bend your knees. If you lie on your back, put a pillow under your knees.  Only take medicines as told by your doctor.  Put ice on the injured area.  Put ice in a plastic bag.  Place a towel between your skin and the bag.  Leave the ice on for 15-20 minutes, 03-04 times a day for the first 2 to 3 days. After that, you can switch between ice and heat packs.  Ask your doctor about back exercises or massage.  Avoid feeling anxious or stressed. Find good ways to deal with stress, such as exercise. GET HELP RIGHT AWAY IF:   Your pain does not go away with rest or medicine.  Your pain does not go away in 1 week.  You have new problems.  You do not feel well.  The pain spreads into your legs.  You cannot control when you poop (bowel movement) or pee (urinate).  Your arms or legs feel weak or lose feeling (numbness).  You feel sick to your stomach (nauseous) or throw up (vomit).  You have belly (abdominal) pain.  You feel like you may pass out (faint). MAKE SURE YOU:   Understand these instructions.  Will watch your condition.  Will get help right away if you are not doing well or get worse. Document Released: 11/30/2007 Document Revised: 09/05/2011 Document Reviewed: 11/01/2010 [facility] Patient Information 2014 [place].  Sciatica Sciatica is pain,  weakness, numbness, or tingling along your sciatic nerve. The nerve starts in the lower back and runs down the back of each leg. Nerve damage or certain conditions pinch or put pressure on the sciatic nerve. This causes the pain, weakness, and other discomforts of sciatica. HOME CARE   Only take medicine as told by your doctor.  Apply ice to the affected area for 20 minutes. Do this 3 4 times a day for the first 48 72 hours. Then try heat in the same way.  Exercise, stretch, or do your usual activities if these do not make your pain worse.  Go to physical therapy as told by your doctor.  Keep all doctor visits as told.  Do not wear high heels or shoes that are not supportive.  Get a firm mattress if your mattress is too soft to lessen pain and discomfort. GET HELP RIGHT AWAY IF:   You cannot control when you poop (bowel movement) or pee (urinate).  You have more weakness in your lower back, lower belly (pelvis), butt (buttocks), or legs.  You have redness or puffiness (swelling) of your back.  You have a burning feeling when you pee.  You have pain that gets worse when you lie down.  You have pain that wakes you from your sleep.  Your pain  is worse than past pain.  Your pain lasts longer than 4 weeks.  You are suddenly losing weight without reason. MAKE SURE YOU:   Understand these instructions.  Will watch this condition.  Will get help right away if you are not doing well or get worse. Document Released: 03/22/2008 Document Revised: 12/13/2011 Document Reviewed: 10/23/2011 [facility] Patient Information 2014 [place]ExitCare, [place]LLC.

## 2013-09-04 NOTE — ED Notes (Signed)
Left sided lower back pain radiating down left leg x 1 wk.  Seen here for same 2 days ago.  Reports was given valium and hydrocodone with no relief.

## 2013-09-05 NOTE — ED Provider Notes (Signed)
Medical screening examination/treatment/procedure(s) were performed by non-physician practitioner and as supervising physician I was immediately available for consultation/collaboration.   EKG Interpretation None        Alexxus Sobh L Karee Christopherson, MD 09/05/13 1535 

## 2013-09-05 NOTE — ED Provider Notes (Signed)
CSN: [id number]     Arrival date & time 09/04/13  1253 History   First MD Initiated Contact with Patient 09/04/13 1315     Chief Complaint  Patient presents with  . Back Pain     (Consider location/radiation/quality/duration/timing/severity/associated sxs/prior Treatment) Patient is a 53 y.o. male presenting with back pain. The history is provided by the patient.  Back Pain Location:  Lumbar spine Quality:  Shooting, stabbing and aching Radiates to:  L posterior upper leg, L thigh and L knee Pain severity:  Moderate Pain is:  Same all the time Onset quality:  Gradual Duration:  2 days Timing:  Constant Progression:  Unchanged Chronicity:  New Context: recent injury and twisting   Relieved by:  Nothing Worsened by:  Bending, twisting and sitting Ineffective treatments:  Narcotics and muscle relaxants Associated symptoms: leg pain   Associated symptoms: no abdominal pain, no abdominal swelling, no bladder incontinence, no bowel incontinence, no chest pain, no dysuria, no fever, no headaches, no numbness, no paresthesias, no pelvic pain, no perianal numbness, no tingling and no weakness    Patient seen here two days ago for sudden onset of left lower back pain that began after a twisting and bending movement. He reports feeling a "pop" in his back afterwards.  Patient returns because medication he was prescribed are not helping his pain.  He denies numbness or weakness of the LE, incontinence of bladder or bowel, or dysuria   Past Medical History  Diagnosis Date  . Arthritis    Past Surgical History  Procedure Laterality Date  . Wrist surgery    . Arm surgery      left   No family history on file. History  Substance Use Topics  . Smoking status: Current Some Day Smoker -- 0.50 packs/day    Types: Cigarettes  . Smokeless tobacco: Not on file  . Alcohol Use: Yes     Comment: occ    Review of Systems  Constitutional: Negative for fever.  Respiratory: Negative for  shortness of breath.   Cardiovascular: Negative for chest pain.  Gastrointestinal: Negative for vomiting, abdominal pain, constipation and bowel incontinence.  Genitourinary: Negative for bladder incontinence, dysuria, hematuria, flank pain, decreased urine volume, penile swelling, scrotal swelling, difficulty urinating, penile pain, testicular pain and pelvic pain.       No perineal numbness or incontinence of urine or feces  Musculoskeletal: Positive for back pain. Negative for joint swelling.  Skin: Negative for rash.  Neurological: Negative for tingling, weakness, numbness, headaches and paresthesias.  All other systems reviewed and are negative.      Allergies  Tylox  Home Medications   Current Outpatient Rx  Name  Route  Sig  Dispense  Refill  . diazepam (VALIUM) 5 MG tablet   Oral   Take 1 tablet (5 mg total) by mouth every 8 (eight) hours as needed for muscle spasms.   10 tablet   0   . HYDROcodone-acetaminophen (NORCO/VICODIN) 5-325 MG per tablet   Oral   Take 1 tablet by mouth every 4 (four) hours as needed.   15 tablet   0   . ibuprofen (ADVIL,MOTRIN) 200 MG tablet   Oral   Take 600 mg by mouth every 6 (six) hours as needed for moderate pain.         . cyclobenzaprine (FLEXERIL) 10 MG tablet   Oral   Take 1 tablet (10 mg total) by mouth 3 (three) times daily as needed.   21 tablet  0   . oxyCODONE-acetaminophen (PERCOCET/ROXICET) 5-325 MG per tablet   Oral   Take 1 tablet by mouth every 4 (four) hours as needed for severe pain.   20 tablet   0   . predniSONE (DELTASONE) 10 MG tablet      Take 6 tablets day one, 5 tablets day two, 4 tablets day three, 3 tablets day four, 2 tablets day five, then 1 tablet day six   21 tablet   0    BP 124/84  Pulse 68  Temp(Src) 97.3 F (36.3 C) (Oral)  Resp 18  Ht 5\' 10"  (1.778 m)  Wt 175 lb (79.379 kg)  BMI 25.11 kg/m2  SpO2 100% Physical Exam  Nursing note and vitals reviewed. Constitutional: He is  oriented to person, place, and time. He appears well-developed and well-nourished. No distress.  HENT:  Head: Normocephalic and atraumatic.  Neck: Normal range of motion. Neck supple.  Cardiovascular: Normal rate, regular rhythm, normal heart sounds and intact distal pulses.   No murmur heard. Pulmonary/Chest: Effort normal and breath sounds normal. No respiratory distress. He exhibits no tenderness.  Abdominal: Soft. He exhibits no distension. There is no tenderness.  Musculoskeletal: He exhibits tenderness. He exhibits no edema.       Lumbar back: He exhibits tenderness and pain. He exhibits normal range of motion, no swelling, no deformity, no laceration and normal pulse.  ttp of the left lumbar spine and paraspinal muscles.    DP pulses are brisk and symmetrical.  Distal sensation intact.  Hip Flexors/Extensors are intact  Neurological: He is alert and oriented to person, place, and time. He has normal strength. No sensory deficit. He exhibits normal muscle tone. Coordination and gait normal.  Reflex Scores:      Patellar reflexes are 2+ on the right side and 2+ on the left side.      Achilles reflexes are 2+ on the right side and 2+ on the left side. Skin: Skin is warm and dry. No rash noted.    ED Course  Procedures (including critical care time) Labs Review Labs Reviewed - No data to display Imaging Review Dg Lumbar Spine Complete  09/04/2013   CLINICAL DATA Left-sided lumbar pain  EXAM LUMBAR SPINE - COMPLETE 4+ VIEW  COMPARISON DG LUMBAR SPINE COMPLETE dated 04/06/2005  FINDINGS There are 5 nonrib bearing lumbar-type vertebral bodies. The vertebral body heights are maintained. The alignment is anatomic. There is no spondylolysis. There is no acute fracture or static listhesis. There is degenerative disc disease at L4-5 and L5-S1.  The SI joints are unremarkable.  IMPRESSION Degenerative disc disease at L4-5 and L5-S1.  SIGNATURE  Electronically Signed   By: Elige KoHetal  Patel   On:  09/04/2013 14:34     EKG Interpretation None      MDM   Final diagnoses:  Sciatica of left side    Previous ed chart reviewed.  XR results discussed wit pt.  No concerning sx's for emergent neurological or infectious process.  Pt ambulatory.  Advised that he may need further evaluation by PMD if back pain not improving.  Agrees to short course of percocet, prednisone and flexeril  Referral given for [facility].  Pt agrees to care plan and verbalized understanding  The patient appears reasonably screened and/or stabilized for discharge and I doubt any other medical condition or other [facility] requiring further screening, evaluation, or treatment in the ED at this time prior to discharge.     Refugio Mcconico L. Arelyn Gauer, PA-C  09/05/13 2143 

## 2013-09-10 ENCOUNTER — Emergency Department (HOSPITAL_COMMUNITY)
Admission: EM | Admit: 2013-09-10 | Discharge: 2013-09-10 | Disposition: A | Discharge status: Home / Self Care | Payer: No Typology Code available for payment source | Attending: Emergency Medicine | Admitting: Emergency Medicine

## 2013-09-10 ENCOUNTER — Encounter (HOSPITAL_COMMUNITY): Payer: Self-pay | Admitting: Emergency Medicine

## 2013-09-10 DIAGNOSIS — M129 Arthropathy, unspecified: Secondary | ICD-10-CM | POA: Insufficient documentation

## 2013-09-10 DIAGNOSIS — M545 Low back pain, unspecified: Secondary | ICD-10-CM | POA: Insufficient documentation

## 2013-09-10 DIAGNOSIS — M549 Dorsalgia, unspecified: Secondary | ICD-10-CM

## 2013-09-10 DIAGNOSIS — F17200 Nicotine dependence, unspecified, uncomplicated: Secondary | ICD-10-CM | POA: Insufficient documentation

## 2013-09-10 MED ORDER — KETOROLAC TROMETHAMINE 60 MG/2ML IM SOLN
60.0000 mg | Freq: Once | INTRAMUSCULAR | Status: AC
  Administered 2013-09-10: 60 mg via INTRAMUSCULAR
  Filled 2013-09-10: qty 2

## 2013-09-10 MED ORDER — CYCLOBENZAPRINE HCL 10 MG PO TABS: 10.0000 mg | ORAL_TABLET | Freq: Two times a day (BID) | ORAL | Status: DC | PRN

## 2013-09-10 MED ORDER — HYDROCODONE-ACETAMINOPHEN 5-325 MG PO TABS: 1.0000 | ORAL_TABLET | Freq: Four times a day (QID) | ORAL | Status: DC | PRN

## 2013-09-10 NOTE — ED Notes (Signed)
Pt reports 2 weeks ago caught his grandchild from falling off of the bed and has had pain in lower back radiating down left leg since then.

## 2013-09-10 NOTE — Discharge Instructions (Signed)
Back Pain, Adult Low back pain is very common. About 1 in 5 people have back pain.The cause of low back pain is rarely dangerous. The pain often gets better over time.About half of people with a sudden onset of back pain feel better in just 2 weeks. About 8 in 10 people feel better by 6 weeks.  CAUSES Some common causes of back pain include:  Strain of the muscles or ligaments supporting the spine.  Wear and tear (degeneration) of the spinal discs.  Arthritis.  Direct injury to the back. DIAGNOSIS Most of the time, the direct cause of low back pain is not known.However, back pain can be treated effectively even when the exact cause of the pain is unknown.Answering your caregiver's questions about your overall health and symptoms is one of the most accurate ways to make sure the cause of your pain is not dangerous. If your caregiver needs more information, he or she may order lab work or imaging tests (X-rays or MRIs).However, even if imaging tests show changes in your back, this usually does not require surgery. HOME CARE INSTRUCTIONS For many people, back pain returns.Since low back pain is rarely dangerous, it is often a condition that people can learn to [facility] their own.   Remain active. It is stressful on the back to sit or stand in one place. Do not sit, drive, or stand in one place for more than 30 minutes at a time. Take short walks on level surfaces as soon as pain allows.Try to increase the length of time you walk each day.  Do not stay in bed.Resting more than 1 or 2 days can delay your recovery.  Do not avoid exercise or work.Your body is made to move.It is not dangerous to be active, even though your back may hurt.Your back will likely heal faster if you return to being active before your pain is gone.  Pay attention to your body when you bend and lift. Many people have less discomfortwhen lifting if they bend their knees, keep the load close to their bodies,and  avoid twisting. Often, the most comfortable positions are those that put less stress on your recovering back.  Find a comfortable position to sleep. Use a firm mattress and lie on your side with your knees slightly bent. If you lie on your back, put a pillow under your knees.  Only take over-the-counter or prescription medicines as directed by your caregiver. Over-the-counter medicines to reduce pain and inflammation are often the most helpful.Your caregiver may prescribe muscle relaxant drugs.These medicines help dull your pain so you can more quickly return to your normal activities and healthy exercise.  Put ice on the injured area.  Put ice in a plastic bag.  Place a towel between your skin and the bag.  Leave the ice on for 15-20 minutes, 03-04 times a day for the first 2 to 3 days. After that, ice and heat may be alternated to reduce pain and spasms.  Ask your caregiver about trying back exercises and gentle massage. This may be of some benefit.  Avoid feeling anxious or stressed.Stress increases muscle tension and can worsen back pain.It is important to recognize when you are anxious or stressed and learn ways to manage it.Exercise is a great option. SEEK MEDICAL CARE IF:  You have pain that is not relieved with rest or medicine.  You have pain that does not improve in 1 week.  You have new symptoms.  You are generally not feeling well. SEEK  IMMEDIATE MEDICAL CARE IF:   You have pain that radiates from your back into your legs.  You develop new bowel or bladder control problems.  You have unusual weakness or numbness in your arms or legs.  You develop nausea or vomiting.  You develop abdominal pain.  You feel faint. Document Released: 06/13/2005 Document Revised: 12/13/2011 Document Reviewed: 11/01/2010 [facility] Patient Information 2014 [place]aches NorthExitCare, MarylandLLC.   Establish a primary MD Do not come back here for pain medications

## 2013-09-10 NOTE — Care Management Note (Signed)
ED/CM noted patient did not have health insurance and/or PCP listed in the computer.  Patient was given the [place] resource handout with information on the clinics, food pantries, and the handout for new health insurance sign-up.  Patient expressed appreciation for information received. 

## 2013-09-10 NOTE — Care Management Note (Signed)
Pt was  given financial counselor,  Kathie RhodesBetty Ratliff's name and number to call for [facility] financial assistance

## 2013-09-10 NOTE — ED Provider Notes (Signed)
CSN: [id number]     Arrival date & time 09/10/13  1526 History   First MD Initiated Contact with Patient 09/10/13 1614     Chief Complaint  Patient presents with  . Back Pain     (Consider location/radiation/quality/duration/timing/severity/associated sxs/prior Treatment) Patient is a 53 y.o. male presenting with back pain. The history is provided by the patient. No language interpreter was used.  Back Pain Location:  Lumbar spine Quality:  Aching Radiates to:  L thigh Associated symptoms: no dysuria, no fever, no numbness, no paresthesias, no perianal numbness, no tingling and no weakness    Pt is a 53 year old male who present with ongoing c/o back pain for a couple weeks. He reports that he is having left lower lumbar pain that radiates down into his left thigh. He reports that he has recently finished a course of steroids, flexeril and oxycodone. However, he reports that he is still having pain. He denies any difficulty walking. No reports of numbness or tingling.   Past Medical History  Diagnosis Date  . Arthritis    Past Surgical History  Procedure Laterality Date  . Wrist surgery    . Arm surgery      left   No family history on file. History  Substance Use Topics  . Smoking status: Current Some Day Smoker -- 0.50 packs/day    Types: Cigarettes  . Smokeless tobacco: Not on file  . Alcohol Use: Yes     Comment: occ    Review of Systems  Constitutional: Negative for fever.  Genitourinary: Negative for dysuria.  Musculoskeletal: Positive for back pain.  Neurological: Negative for tingling, weakness, numbness and paresthesias.  All other systems reviewed and are negative.      Allergies  Tylox  Home Medications   Current Outpatient Rx  Name  Route  Sig  Dispense  Refill  . cyclobenzaprine (FLEXERIL) 10 MG tablet   Oral   Take 1 tablet (10 mg total) by mouth 3 (three) times daily as needed.   21 tablet   0   . diazepam (VALIUM) 5 MG tablet   Oral    Take 1 tablet (5 mg total) by mouth every 8 (eight) hours as needed for muscle spasms.   10 tablet   0   . HYDROcodone-acetaminophen (NORCO/VICODIN) 5-325 MG per tablet   Oral   Take 1 tablet by mouth every 4 (four) hours as needed.   15 tablet   0   . ibuprofen (ADVIL,MOTRIN) 200 MG tablet   Oral   Take 600 mg by mouth every 6 (six) hours as needed for moderate pain.         Marland Kitchen oxyCODONE-acetaminophen (PERCOCET/ROXICET) 5-325 MG per tablet   Oral   Take 1 tablet by mouth every 4 (four) hours as needed for severe pain.   20 tablet   0   . predniSONE (DELTASONE) 10 MG tablet      Take 6 tablets day one, 5 tablets day two, 4 tablets day three, 3 tablets day four, 2 tablets day five, then 1 tablet day six   21 tablet   0    BP 148/85  Pulse 99  Temp(Src) 97.8 F (36.6 C) (Oral)  Resp 20  SpO2 100% Physical Exam  Vitals reviewed. Constitutional: He is oriented to person, place, and time. He appears well-developed and well-nourished.  Well-appearing  HENT:  Head: Normocephalic and atraumatic.  Eyes: Conjunctivae and EOM are normal.  Neck: Normal range of motion.  Neck supple. No JVD present. No tracheal deviation present. No thyromegaly present.  Cardiovascular: Normal rate, regular rhythm and normal heart sounds.   Pulmonary/Chest: Effort normal and breath sounds normal. No respiratory distress. He has no wheezes.  Musculoskeletal: Normal range of motion.  Left lumbar paravertebral tenderness. No midline spinal tenderness, deformities or step offs. Patient reports that pain radiates from left lumbar region down into left thigh. No numbness or tingling. No gait deficits or focal weakness.  Lymphadenopathy:    He has no cervical adenopathy.  Neurological: He is alert and oriented to person, place, and time.  Skin: Skin is warm and dry. No rash noted. No erythema.  Psychiatric: He has a normal mood and affect. His behavior is normal. Judgment and thought content normal.     ED Course  Procedures (including critical care time) Labs Review Labs Reviewed - No data to display Imaging Review No results found.   EKG Interpretation None      MDM   Final diagnoses:  Back pain    Paravertebral tenderness. Ambulatory without any deficits or focal weakness. Good strength, coordination and distal pulses 2+. Reports some relief after Toradol injection.       Irish EldersKelly Isiaha Greenup, NP 09/18/13 1941

## 2013-09-12 NOTE — ED Provider Notes (Signed)
Medical screening examination/treatment/procedure(s) were performed by non-physician practitioner and as supervising physician I was immediately available for consultation/collaboration.   EKG Interpretation None        Shelda JakesScott W. Avantae Bither, MD 09/12/13 [phone]

## 2013-09-21 NOTE — ED Provider Notes (Signed)
Medical screening examination/treatment/procedure(s) were performed by non-physician practitioner and as supervising physician I was immediately available for consultation/collaboration.   EKG Interpretation None       Riki Gehring, MD 09/21/13 1554 

## 2013-12-24 ENCOUNTER — Emergency Department (HOSPITAL_COMMUNITY)
Admission: EM | Admit: 2013-12-24 | Discharge: 2013-12-25 | Disposition: A | Discharge status: Home / Self Care | Payer: Self-pay | Attending: Emergency Medicine | Admitting: Emergency Medicine

## 2013-12-24 ENCOUNTER — Encounter (HOSPITAL_COMMUNITY): Payer: Self-pay | Admitting: Emergency Medicine

## 2013-12-24 ENCOUNTER — Emergency Department (HOSPITAL_COMMUNITY): Payer: Self-pay

## 2013-12-24 DIAGNOSIS — M549 Dorsalgia, unspecified: Secondary | ICD-10-CM | POA: Insufficient documentation

## 2013-12-24 DIAGNOSIS — T148XXA Other injury of unspecified body region, initial encounter: Secondary | ICD-10-CM

## 2013-12-24 DIAGNOSIS — M129 Arthropathy, unspecified: Secondary | ICD-10-CM | POA: Insufficient documentation

## 2013-12-24 DIAGNOSIS — F17200 Nicotine dependence, unspecified, uncomplicated: Secondary | ICD-10-CM | POA: Insufficient documentation

## 2013-12-24 DIAGNOSIS — M79672 Pain in left foot: Secondary | ICD-10-CM

## 2013-12-24 DIAGNOSIS — Y9389 Activity, other specified: Secondary | ICD-10-CM | POA: Insufficient documentation

## 2013-12-24 DIAGNOSIS — IMO0002 Reserved for concepts with insufficient information to code with codable children: Secondary | ICD-10-CM | POA: Insufficient documentation

## 2013-12-24 DIAGNOSIS — Y929 Unspecified place or not applicable: Secondary | ICD-10-CM | POA: Insufficient documentation

## 2013-12-24 DIAGNOSIS — S91309A Unspecified open wound, unspecified foot, initial encounter: Secondary | ICD-10-CM | POA: Insufficient documentation

## 2013-12-24 DIAGNOSIS — Z23 Encounter for immunization: Secondary | ICD-10-CM | POA: Insufficient documentation

## 2013-12-24 MED ORDER — IBUPROFEN 400 MG PO TABS
600.0000 mg | ORAL_TABLET | Freq: Once | ORAL | Status: AC
  Administered 2013-12-25: 600 mg via ORAL
  Filled 2013-12-24: qty 2

## 2013-12-24 MED ORDER — TETANUS-DIPHTH-ACELL PERTUSSIS 5-2.5-18.5 LF-MCG/0.5 IM SUSP
0.5000 mL | Freq: Once | INTRAMUSCULAR | Status: AC
  Administered 2013-12-25: 0.5 mL via INTRAMUSCULAR
  Filled 2013-12-24: qty 0.5

## 2013-12-24 MED ORDER — LEVOFLOXACIN 750 MG PO TABS
750.0000 mg | ORAL_TABLET | Freq: Once | ORAL | Status: AC
  Administered 2013-12-25: 750 mg via ORAL
  Filled 2013-12-24: qty 1

## 2013-12-24 MED ORDER — HYDROCODONE-ACETAMINOPHEN 5-325 MG PO TABS
1.0000 | ORAL_TABLET | Freq: Once | ORAL | Status: AC
  Administered 2013-12-25: 1 via ORAL
  Filled 2013-12-24: qty 1

## 2013-12-24 MED ORDER — LEVOFLOXACIN 750 MG PO TABS: 750.0000 mg | ORAL_TABLET | Freq: Every day | ORAL | Status: DC

## 2013-12-24 NOTE — Discharge Instructions (Signed)
For signs of worsening infection including spreading redness of the foot and leg, fevers, chills, pus draining or new concerns. Is very important for you see the orthopedic surgeon in the next 48 hours. Take tylenol every 4 hours as needed (15 mg per kg) and take motrin (ibuprofen) every 6 hours as needed for fever or pain (10 mg per kg). Return for any changes, weird rashes, neck stiffness, change in behavior, new or worsening concerns.  Follow up with your physician as directed. Thank you Filed Vitals:   12/24/13 2210  BP: 142/76  Pulse: 92  Temp: 98.1 F (36.7 C)  TempSrc: Oral  Resp: 18  Height: 5' 9.5" (1.765 m)  Weight: 180 lb (81.647 kg)  SpO2: 97%

## 2013-12-24 NOTE — ED Notes (Addendum)
Pt reports stepping on a nail yesterday.  Reports area red and swollen.  Also reports straining back when stepping on the nail.  Unknown when last T-dap was.

## 2013-12-24 NOTE — ED Provider Notes (Signed)
CSN: [id number]     Arrival date & time 12/24/13  2207 History  This chart was scribed for Enid SkeensJoshua M Nesta Kimple, MD by Charline BillsEssence Howell, ED Scribe. The patient was seen in room APA12/[facility]12. Patient's care was started at 11:23 PM.   Chief Complaint  Patient presents with  . Foot Pain   The history is provided by the patient. No language interpreter was used.   HPI Comments: George Beltran is a 53 y.o. male who presents to the Emergency Department complaining of constant L foot pain onset yesterday. Pt states that he was walking down the street when he stepped on a nail twice. Pt was wearing shoes when the incident occurred. Pt states that he removed the nail, including the tip. He reports associated swelling and numbness to the area. He denies fever and chills. No known allergies. No pertinent medical history.  Past Medical History  Diagnosis Date  . Arthritis    Past Surgical History  Procedure Laterality Date  . Wrist surgery    . Arm surgery      left   History reviewed. No pertinent family history. History  Substance Use Topics  . Smoking status: Current Some Day Smoker -- 0.50 packs/day    Types: Cigarettes  . Smokeless tobacco: Not on file  . Alcohol Use: Yes     Comment: occ    Review of Systems  Constitutional: Negative for fever and chills.  Musculoskeletal: Positive for back pain and myalgias.  Skin: Positive for wound.  Neurological: Positive for numbness.  All other systems reviewed and are negative.  Allergies  Tylox  Home Medications   Prior to Admission medications   Medication Sig Start Date End Date Taking? Authorizing Provider  acetaminophen (TYLENOL) 500 MG tablet Take 1,000 mg by mouth every 6 (six) hours as needed.   Yes Historical Provider, MD   Triage Vitals: BP 142/76  Pulse 92  Temp(Src) 98.1 F (36.7 C) (Oral)  Resp 18  Ht 5' 9.5" (1.765 m)  Wt 180 lb (81.647 kg)  BMI 26.21 kg/m2  SpO2 97% Physical Exam  Nursing note and vitals  reviewed. Constitutional: He is oriented to person, place, and time. He appears well-developed and well-nourished. No distress.  HENT:  Head: Normocephalic and atraumatic.  Eyes: Conjunctivae and EOM are normal.  Neck: Neck supple. No tracheal deviation present.  Cardiovascular: Normal rate.   Pulmonary/Chest: Effort normal. No respiratory distress.  Musculoskeletal: Normal range of motion. He exhibits tenderness.       Left ankle: He exhibits no swelling.       Lumbar back: He exhibits tenderness.       Left foot: He exhibits tenderness and swelling.  L foot: 2 small puncture wound distal plantar surface of foot No drainage, pus, induration, signs of infection Mild swelling distal dorsum of foot Tender to palpation No ankle swelling No leg swelling Overall well appearing  Back:  Lumbosacral junction tenderness  Neurological: He is alert and oriented to person, place, and time.  Skin: Skin is warm and dry.  Psychiatric: He has a normal mood and affect. His behavior is normal.   ED Course  Procedures (including critical care time) DIAGNOSTIC STUDIES: Oxygen Saturation is 97% on RA, normal by my interpretation.    COORDINATION OF CARE: 11:27 PM-Discussed treatment plan with pt at bedside which includes imaging and pt agreed to plan.   Labs Review Labs Reviewed - No data to display  Imaging Review Dg Foot Complete Left  12/25/2013  CLINICAL DATA:  Left foot pain.  Redness and swelling  EXAM: LEFT FOOT - COMPLETE 3+ VIEW  COMPARISON:  None.  FINDINGS: There is no evidence of fracture or dislocation. There is no evidence of arthropathy or other focal bone abnormality. Soft tissues are unremarkable.  IMPRESSION: Negative.   Electronically Signed   By: Signa Kellaylor  Stroud M.D.   On: 12/25/2013 00:51     EKG Interpretation None      MDM   Final diagnoses:  Puncture wound  Foot pain, left   Well-appearing patient with puncture wound from a nail for which she was able to remove  it was not broken. Plan for pain meds, tetanus, antibiotics to cover Pseudomonas as patient was wearing a rubber shoe. Discussed very close followup with orthopedics in and reasons to return.  X-ray reviewed no acute fracture or signs of osteomyelitis. Results and differential diagnosis were discussed with the patient/parent/guardian. Close follow up outpatient was discussed, comfortable with the plan.   Medications  ibuprofen (ADVIL,MOTRIN) tablet 600 mg (not administered)  levofloxacin (LEVAQUIN) tablet 750 mg (not administered)  HYDROcodone-acetaminophen (NORCO/VICODIN) 5-325 MG per tablet 1 tablet (not administered)    Filed Vitals:   12/24/13 2210  BP: 142/76  Pulse: 92  Temp: 98.1 F (36.7 C)  TempSrc: Oral  Resp: 18  Height: 5' 9.5" (1.765 m)  Weight: 180 lb (81.647 kg)  SpO2: 97%      I personally performed the services described in this documentation, which was scribed in my presence. The recorded information has been reviewed and is accurate.    Enid SkeensJoshua M Momoko Slezak, MD 12/25/13 [phone]

## 2015-05-03 ENCOUNTER — Encounter (HOSPITAL_COMMUNITY): Payer: Self-pay | Admitting: Emergency Medicine

## 2015-05-03 ENCOUNTER — Emergency Department (HOSPITAL_COMMUNITY)
Admission: EM | Admit: 2015-05-03 | Discharge: 2015-05-03 | Disposition: A | Discharge status: Home / Self Care | Payer: Self-pay | Attending: Emergency Medicine | Admitting: Emergency Medicine

## 2015-05-03 DIAGNOSIS — M24112 Other articular cartilage disorders, left shoulder: Secondary | ICD-10-CM | POA: Insufficient documentation

## 2015-05-03 DIAGNOSIS — Z720 Tobacco use: Secondary | ICD-10-CM | POA: Insufficient documentation

## 2015-05-03 DIAGNOSIS — Z792 Long term (current) use of antibiotics: Secondary | ICD-10-CM | POA: Insufficient documentation

## 2015-05-03 DIAGNOSIS — Y9389 Activity, other specified: Secondary | ICD-10-CM | POA: Insufficient documentation

## 2015-05-03 DIAGNOSIS — Y9289 Other specified places as the place of occurrence of the external cause: Secondary | ICD-10-CM | POA: Insufficient documentation

## 2015-05-03 DIAGNOSIS — Z8739 Personal history of other diseases of the musculoskeletal system and connective tissue: Secondary | ICD-10-CM | POA: Insufficient documentation

## 2015-05-03 DIAGNOSIS — Y998 Other external cause status: Secondary | ICD-10-CM | POA: Insufficient documentation

## 2015-05-03 DIAGNOSIS — S40262A Insect bite (nonvenomous) of left shoulder, initial encounter: Secondary | ICD-10-CM | POA: Insufficient documentation

## 2015-05-03 DIAGNOSIS — W57XXXA Bitten or stung by nonvenomous insect and other nonvenomous arthropods, initial encounter: Secondary | ICD-10-CM | POA: Insufficient documentation

## 2015-05-03 MED ORDER — DEXAMETHASONE SODIUM PHOSPHATE 4 MG/ML IJ SOLN
8.0000 mg | Freq: Once | INTRAMUSCULAR | Status: AC
  Administered 2015-05-03: 8 mg via INTRAMUSCULAR
  Filled 2015-05-03: qty 2

## 2015-05-03 MED ORDER — TRIAMCINOLONE ACETONIDE 0.1 % EX CREA: 1.0000 "application " | TOPICAL_CREAM | Freq: Two times a day (BID) | CUTANEOUS | Status: DC

## 2015-05-03 NOTE — ED Provider Notes (Signed)
History  By signing my name below, I, Karle PlumberJennifer Tensley, attest that this documentation has been prepared under the direction and in the presence of Ivery QualeHobson Harvie Morua, PA-C. Electronically Signed: Karle PlumberJennifer Tensley, ED Scribe. 05/03/2015. 12:48 PM.  Chief Complaint  Patient presents with  . Abscess   The history is provided by the patient and medical records. No language interpreter was used.    HPI Comments:  George Beltran is a 54 y.o. male who presents to the Emergency Department complaining of several bite-like bumps located to the left shoulder that appeared approximately 24 hours ago. He reports one is larger than the other and is concerned it may be an abscess. Pt describes the pain as soreness and is moderate. He states he had similar symptoms about one week ago when he was working and several bugs bit him. He has not done anything to treat the area. He denies modifying factors. He denies fever, chills, nausea or vomiting. He denies PMHx of DM.  Past Medical History  Diagnosis Date  . Arthritis    Past Surgical History  Procedure Laterality Date  . Wrist surgery    . Arm surgery      left   History reviewed. No pertinent family history. Social History  Substance Use Topics  . Smoking status: Current Some Day Smoker -- 0.50 packs/day    Types: Cigarettes  . Smokeless tobacco: None  . Alcohol Use: Yes     Comment: occ    Review of Systems  Constitutional: Negative for fever and chills.  Gastrointestinal: Negative for nausea and vomiting.  Skin: Positive for rash.  All other systems reviewed and are negative.   Allergies  Tylox  Home Medications   Prior to Admission medications   Medication Sig Start Date End Date Taking? Authorizing Provider  acetaminophen (TYLENOL) 500 MG tablet Take 1,000 mg by mouth every 6 (six) hours as needed.    Historical Provider, MD  levofloxacin (LEVAQUIN) 750 MG tablet Take 1 tablet (750 mg total) by mouth daily. 12/24/13   Blane OharaJoshua Zavitz, MD    Triage Vitals: BP 137/84 mmHg  Pulse 74  Temp(Src) 98.2 F (36.8 C) (Oral)  Resp 18  Ht 5' 9.5" (1.765 m)  Wt 170 lb (77.111 kg)  BMI 24.75 kg/m2  SpO2 99% Physical Exam  Constitutional: He is oriented to person, place, and time. He appears well-developed and well-nourished.  HENT:  Head: Normocephalic and atraumatic.  Eyes: EOM are normal.  Neck: Normal range of motion.  Cardiovascular: Normal rate.   Pulmonary/Chest: Effort normal.  Musculoskeletal: Normal range of motion.  Crepitus of left shoulder. Soreness with ROM.  Lymphadenopathy:    He has no cervical adenopathy.  Neurological: He is alert and oriented to person, place, and time.  Skin: Skin is warm and dry.  7 raised, red maculopapular areas of left shoulder No red streaking appreciated Sore to touch No drainage  Psychiatric: He has a normal mood and affect. His behavior is normal.  Nursing note and vitals reviewed.   ED Course  Procedures (including critical care time) DIAGNOSTIC STUDIES: Oxygen Saturation is 99% on RA, normal by my interpretation.   COORDINATION OF CARE: 12:47 PM- Will order injection of Decadron and advised pt to take Benadryl at night and an OTC antihistamine such as Allegra during the day. Pt verbalizes understanding and agrees to plan.  Medications - No data to display   MDM  The examination favors insect bites to the left shoulder. The patient will use triamcinolone  cream 2 or 3 times daily. He will also use Allegra and Benadryl for itching. He is to return to the emergency department if any signs of secondary infection.    Final diagnoses:  None    *I have reviewed nursing notes, vital signs, and all appropriate lab and imaging results for this patient.**  I personally performed the services described in this documentation, which was scribed in my presence. The recorded information has been reviewed and is accurate.    Ivery Quale, PA-C 05/03/15 1304  Vanetta Mulders,  MD 05/04/15 [phone]

## 2015-05-03 NOTE — ED Notes (Signed)
Pt c/o abscesss or insect bite to left posterior shoulder.

## 2015-05-03 NOTE — Discharge Instructions (Signed)
Please apply triamcinolone cream 2 or 3 times daily to the affected areas. Please use Allegra each morning for itching, use Benadryl at bedtime for itching.

## 2015-08-11 ENCOUNTER — Emergency Department (HOSPITAL_COMMUNITY): Payer: Self-pay

## 2015-08-11 ENCOUNTER — Emergency Department (HOSPITAL_COMMUNITY)
Admission: EM | Admit: 2015-08-11 | Discharge: 2015-08-11 | Disposition: A | Discharge status: Home / Self Care | Payer: Self-pay | Attending: Emergency Medicine | Admitting: Emergency Medicine

## 2015-08-11 ENCOUNTER — Encounter (HOSPITAL_COMMUNITY): Payer: Self-pay | Admitting: Emergency Medicine

## 2015-08-11 DIAGNOSIS — M79621 Pain in right upper arm: Secondary | ICD-10-CM

## 2015-08-11 DIAGNOSIS — I809 Phlebitis and thrombophlebitis of unspecified site: Secondary | ICD-10-CM

## 2015-08-11 DIAGNOSIS — M791 Myalgia: Secondary | ICD-10-CM | POA: Insufficient documentation

## 2015-08-11 DIAGNOSIS — M25511 Pain in right shoulder: Secondary | ICD-10-CM | POA: Insufficient documentation

## 2015-08-11 DIAGNOSIS — F17210 Nicotine dependence, cigarettes, uncomplicated: Secondary | ICD-10-CM | POA: Insufficient documentation

## 2015-08-11 LAB — CBC WITH DIFFERENTIAL/PLATELET
BASOS PCT: 0 %
Basophils Absolute: 0 10*3/uL (ref 0.0–0.1)
EOS ABS: 1 10*3/uL — AB (ref 0.0–0.7)
Eosinophils Relative: 12 %
HCT: 40.3 % (ref 39.0–52.0)
Hemoglobin: 13.6 g/dL (ref 13.0–17.0)
LYMPHS ABS: 2.3 10*3/uL (ref 0.7–4.0)
Lymphocytes Relative: 26 %
MCH: 32.5 pg (ref 26.0–34.0)
MCHC: 33.7 g/dL (ref 30.0–36.0)
MCV: 96.4 fL (ref 78.0–100.0)
MONO ABS: 0.4 10*3/uL (ref 0.1–1.0)
MONOS PCT: 5 %
Neutro Abs: 5.1 10*3/uL (ref 1.7–7.7)
Neutrophils Relative %: 57 %
PLATELETS: 303 10*3/uL (ref 150–400)
RBC: 4.18 MIL/uL — AB (ref 4.22–5.81)
RDW: 14.2 % (ref 11.5–15.5)
WBC: 8.9 10*3/uL (ref 4.0–10.5)

## 2015-08-11 LAB — COMPREHENSIVE METABOLIC PANEL
ALBUMIN: 3.5 g/dL (ref 3.5–5.0)
ALT: 23 U/L (ref 17–63)
AST: 25 U/L (ref 15–41)
Alkaline Phosphatase: 50 U/L (ref 38–126)
Anion gap: 8 (ref 5–15)
BUN: 18 mg/dL (ref 6–20)
CHLORIDE: 108 mmol/L (ref 101–111)
CO2: 26 mmol/L (ref 22–32)
Calcium: 8.4 mg/dL — ABNORMAL LOW (ref 8.9–10.3)
Creatinine, Ser: 0.93 mg/dL (ref 0.61–1.24)
GFR calc Af Amer: >60 mL/min (ref >60)
GLUCOSE: 94 mg/dL (ref 65–99)
POTASSIUM: 4.1 mmol/L (ref 3.5–5.1)
SODIUM: 142 mmol/L (ref 135–145)
Total Bilirubin: 0.4 mg/dL (ref 0.3–1.2)
Total Protein: 6.5 g/dL (ref 6.5–8.1)

## 2015-08-11 NOTE — ED Provider Notes (Signed)
CSN: [id number]     Arrival date & time 08/11/15  1205 History  By signing my name below, I, Ronney Lion, attest that this documentation has been prepared under the direction and in the presence of Donnetta Hutching, MD. Electronically Signed: Ronney Lion, ED Scribe. 08/11/2015. 4:07 PM.    Chief Complaint  Patient presents with  . Chest Pain   The history is provided by the patient. No language interpreter was used.   HPI Comments: Behr Cislo is a 55 y.o. male with a history of arthritis, who presents to the Emergency Department complaining of burning right shoulder pain radiating down to his right wrist that began 1 week ago. He also complains of associated swelling in his medial distal biceps. He states he is usually healthy, and denies a history of any cardiac conditions or DM. No chest pain or dyspnea  Past Medical History  Diagnosis Date  . Arthritis    Past Surgical History  Procedure Laterality Date  . Wrist surgery    . Arm surgery      left   History reviewed. No pertinent family history. Social History  Substance Use Topics  . Smoking status: Current Some Day Smoker -- 0.50 packs/day    Types: Cigarettes  . Smokeless tobacco: None  . Alcohol Use: Yes     Comment: occ    Review of Systems  Musculoskeletal: Positive for myalgias (right shoulder pain radiating to right wrist, with associated swelling).  All other systems reviewed and are negative.  Allergies  Tylox  Home Medications   Prior to Admission medications   Not on File   BP 136/102 mmHg  Pulse 63  Temp(Src) 98 F (36.7 C) (Oral)  Resp 18  Ht  (1.753 m)  Wt 180 lb (81.647 kg)  BMI 26.57 kg/m2  SpO2 99% Physical Exam  Constitutional: He is oriented to person, place, and time. He appears well-developed and well-nourished. No distress.  HENT:  Head: Normocephalic and atraumatic.  Eyes: Conjunctivae and EOM are normal.  Neck: Neck supple. No tracheal deviation present.  Cardiovascular: Normal rate.    Pulmonary/Chest: Effort normal. No respiratory distress.  Musculoskeletal: Normal range of motion.  RUE: Tender throughout right shoulder distally, throughout whole arm. Cord-like, palpable area on medial biceps, from the elbow to the shoulder.   Neurological: He is alert and oriented to person, place, and time.  Skin: Skin is warm and dry.  Psychiatric: He has a normal mood and affect. His behavior is normal.  Nursing note and vitals reviewed.    ED Course  Procedures (including critical care time)  DIAGNOSTIC STUDIES: Oxygen Saturation is 98% on RA, normal by my interpretation.    COORDINATION OF CARE: 12:48 PM - Discussed treatment plan with pt at bedside which includes u/s of right arm and blood tests. Pt verbalized understanding and agreed to plan.   Labs Review Labs Reviewed  CBC WITH DIFFERENTIAL/PLATELET - Abnormal; Notable for the following:    RBC 4.18 (*)    Eosinophils Absolute 1.0 (*)    All other components within normal limits  COMPREHENSIVE METABOLIC PANEL - Abnormal; Notable for the following:    Calcium 8.4 (*)    All other components within normal limits    Imaging Review US Venous Img Upper Uni Right  08/11/2015  CLINICAL DATA:  Acute right upper extremity pain. EXAM: Right UPPER EXTREMITY VENOUS DOPPLER ULTRASOUND TECHNIQUE: Gray-scale sonography with graded compression, as well as color Doppler and duplex ultrasound were performed to  evaluate the upper extremity deep venous system from the level of the subclavian vein and including the jugular, axillary, basilic, radial, ulnar and upper cephalic vein. Spectral Doppler was utilized to evaluate flow at rest and with distal augmentation maneuvers. COMPARISON:  None. FINDINGS: Contralateral Subclavian Vein: Respiratory phasicity is normal and symmetric with the symptomatic side. No evidence of thrombus. Normal compressibility. Internal Jugular Vein: No evidence of thrombus. Normal compressibility, respiratory  phasicity and response to augmentation. Subclavian Vein: No evidence of thrombus. Normal compressibility, respiratory phasicity and response to augmentation. Axillary Vein: No evidence of thrombus. Normal compressibility, respiratory phasicity and response to augmentation. Cephalic Vein: No evidence of thrombus. Normal compressibility, respiratory phasicity and response to augmentation. Basilic Vein: No evidence of thrombus. Normal compressibility, respiratory phasicity and response to augmentation. Brachial Veins: No evidence of thrombus. Normal compressibility, respiratory phasicity and response to augmentation. Radial Veins: No evidence of thrombus. Normal compressibility, respiratory phasicity and response to augmentation. Ulnar Veins: No evidence of thrombus. Normal compressibility, respiratory phasicity and response to augmentation. Venous Reflux:  None visualized. Other Findings: There appears to be a thrombosis superficial vein in the medial portion of the right upper arm suggesting superficial thrombophlebitis. IMPRESSION: No evidence of deep venous thrombosis. Possible superficial thrombophlebitis involving superficial vein in medial portion of right upper arm. Electronically Signed   By: Lupita Raider, M.D.   On: 08/11/2015 14:22   I have personally reviewed and evaluated these images and lab results as part of my medical decision-making.   EKG Interpretation   Date/Time:  Tuesday August 11 2015 12:19:36 EST Ventricular Rate:  65 PR Interval:  188 QRS Duration: 78 QT Interval:  396 QTC Calculation: 411 R Axis:   41 Text Interpretation:  Normal sinus rhythm Septal infarct , age  undetermined Abnormal ECG Confirmed by Oday Ridings  MD, Elo Marmolejos ([id number]) on  08/11/2015 1:15:30 PM      MDM   Final diagnoses:  Pain of right upper arm  Thrombophlebitis   Ultrasound right upper extremity reveals a possible superficial thrombophlebitis in the medial aspect of the upper arm. This is consistent  with his history and physical. No evidence of a DVT. Elevation, heat, aspirin.   I personally performed the services described in this documentation, which was scribed in my presence. The recorded information has been reviewed and is accurate.      Donnetta Hutching, MD 08/11/15 [phone]

## 2015-08-11 NOTE — ED Notes (Signed)
Discharged per md instruct.Pt verbalizes understanding of DC instruct, med regime and followup. Ambulates heel to toe to exit with sister accompanying

## 2015-08-11 NOTE — ED Notes (Signed)
PT c/o Right sided chest pain (burning) radiating down right arm pain more upon movement x3 days. PT c/o pain with chest wall palpation.

## 2015-08-11 NOTE — Discharge Instructions (Signed)
You have an irritated vein in your arm called thrombophlebitis.  Elevate arm, minimize range of motion, heating pad, one aspirin tablet twice a day. Return if worse.

## 2015-08-17 ENCOUNTER — Ambulatory Visit: Payer: Self-pay | Admitting: Physician Assistant

## 2015-08-18 ENCOUNTER — Encounter: Payer: Self-pay | Admitting: Physician Assistant

## 2016-01-18 ENCOUNTER — Encounter (HOSPITAL_COMMUNITY): Payer: Self-pay | Admitting: Emergency Medicine

## 2016-01-18 ENCOUNTER — Emergency Department (HOSPITAL_COMMUNITY)
Admission: EM | Admit: 2016-01-18 | Discharge: 2016-01-18 | Disposition: A | Discharge status: Home / Self Care | Payer: Self-pay | Attending: Emergency Medicine | Admitting: Emergency Medicine

## 2016-01-18 ENCOUNTER — Emergency Department (HOSPITAL_COMMUNITY): Payer: Self-pay

## 2016-01-18 DIAGNOSIS — S61412A Laceration without foreign body of left hand, initial encounter: Secondary | ICD-10-CM | POA: Insufficient documentation

## 2016-01-18 DIAGNOSIS — Y9389 Activity, other specified: Secondary | ICD-10-CM | POA: Insufficient documentation

## 2016-01-18 DIAGNOSIS — Y999 Unspecified external cause status: Secondary | ICD-10-CM | POA: Insufficient documentation

## 2016-01-18 DIAGNOSIS — M1990 Unspecified osteoarthritis, unspecified site: Secondary | ICD-10-CM | POA: Insufficient documentation

## 2016-01-18 DIAGNOSIS — Y929 Unspecified place or not applicable: Secondary | ICD-10-CM | POA: Insufficient documentation

## 2016-01-18 DIAGNOSIS — F17210 Nicotine dependence, cigarettes, uncomplicated: Secondary | ICD-10-CM | POA: Insufficient documentation

## 2016-01-18 DIAGNOSIS — W260XXA Contact with knife, initial encounter: Secondary | ICD-10-CM | POA: Insufficient documentation

## 2016-01-18 MED ORDER — LIDOCAINE HCL (PF) 2 % IJ SOLN
10.0000 mL | Freq: Once | INTRAMUSCULAR | Status: DC
  Filled 2016-01-18: qty 10

## 2016-01-18 MED ORDER — POVIDONE-IODINE 10 % EX SOLN
CUTANEOUS | Status: AC
  Filled 2016-01-18: qty 118

## 2016-01-18 NOTE — ED Triage Notes (Signed)
Pt c/o laceration to the left hand after trying to separated frozen hamburger with knife.

## 2016-01-21 NOTE — ED Provider Notes (Signed)
[facility]-EMERGENCY DEPT Provider Note   CSN: [id number] Arrival date & time: 01/18/16  1946  First Provider Contact:  First MD Initiated Contact with Patient 01/18/16 2111        History   Chief Complaint Chief Complaint  Patient presents with  . Laceration    HPI George Beltran is a 55 y.o. male.  George Beltran is a 55 y.o. Right handed  Male presenting with laceration to his left hand occurring just prior to arrival.  He was trying to pry frozen hamburger patties apart when the injury occurred.  He reports copious bleeding which has improved with direct pressure.  He denies weakness or numbness in his fingers distal to the injury site.  He is utd with his tetanus.     The history is provided by the patient.    Past Medical History:  Diagnosis Date  . Arthritis     There are no active problems to display for this patient.   Past Surgical History:  Procedure Laterality Date  . arm surgery     left  . WRIST SURGERY         Home Medications    Prior to Admission medications   Not on File    Family History History reviewed. No pertinent family history.  Social History Social History  Substance Use Topics  . Smoking status: Current Some Day Smoker    Packs/day: 0.50    Types: Cigarettes  . Smokeless tobacco: Never Used  . Alcohol use Yes     Comment: occ     Allergies   Tylox [oxycodone-acetaminophen]   Review of Systems Review of Systems  Constitutional: Negative for chills and fever.  Respiratory: Negative for shortness of breath and wheezing.   Skin: Positive for wound.  Neurological: Negative for weakness and numbness.     Physical Exam Updated Vital Signs BP 158/98 (BP Location: Left Arm)   Pulse 83   Temp 98.1 F (36.7 C) (Oral)   Resp 16   Ht  (1.778 m)   Wt 81.6 kg   SpO2 100%   BMI 25.83 kg/m   Physical Exam  Constitutional: He is oriented to person, place, and time. He appears well-developed and well-nourished.  HENT:    Head: Normocephalic.  Cardiovascular: Normal rate.   Pulmonary/Chest: Effort normal.  Musculoskeletal: He exhibits no edema, tenderness or deformity.  Neurological: He is alert and oriented to person, place, and time. No sensory deficit.  Skin: Capillary refill takes less than 2 seconds. Laceration noted.  Subcutaneous laceration left distal volar hand proximal to the index and long fingers. Hemostatic. Distal sensation intact.  Pt has FROM of digits distal to the injury site.       ED Treatments / Results  Labs (all labs ordered are listed, but only abnormal results are displayed) Labs Reviewed - No data to display  EKG  EKG Interpretation None       Radiology  Final result by Alcide Clever, MD (01/18/16 20:17:32)           Narrative:   CLINICAL DATA: Laceration injury while cutting meat, initial encounter EXAM: LEFT HAND - COMPLETE 3+ VIEW COMPARISON: None. FINDINGS: Soft tissue swelling is noted in the distal aspects of the second and third metacarpals consistent with the recent injury. No acute fracture or radiopaque foreign body is noted. No other focal abnormality is seen. IMPRESSION: Soft tissue injury without acute bony abnormality. Electronically Signed By: Alcide Clever M.D. On: 01/18/2016 20:17  Procedures Procedures (including critical care time)  LACERATION REPAIR Performed by: Burgess Amor Authorized by: Burgess Amor Consent: Verbal consent obtained. Risks and benefits: risks, benefits and alternatives were discussed Consent given by: patient Patient identity confirmed: provided demographic data Prepped and Draped in normal sterile fashion Wound explored  Laceration Location: left hand  Laceration Length: 2cm  No Foreign Bodies seen or palpated  Anesthesia: local infiltration  Local anesthetic: lidocaine 1% without epinephrine  Anesthetic total: 3 ml  Irrigation method: syringe Amount of cleaning: copious using  saline after betadine scrub  Skin closure: prolene 4-0  Number of sutures: 5  Technique: simple interupted  Patient tolerance: Patient tolerated the procedure well with no immediate complications.    Medications Ordered in ED Medications - No data to display   Initial Impression / Assessment and Plan / ED Course  I have reviewed the triage vital signs and the nursing notes.  Pertinent labs & imaging results that were available during my care of the patient were reviewed by me and considered in my medical decision making (see chart for details).  Clinical Course    Wound care instructions given.  Pt advised to have sutures removed in 10 days,  Return here sooner for any signs of infection including redness, swelling, worse pain or drainage of pus.     Final Clinical Impressions(s) / ED Diagnoses   Final diagnoses:  Laceration of hand, left, initial encounter    New Prescriptions There are no discharge medications for this patient.    Burgess Amor, PA-C 01/21/16 1345    Glynn Octave, MD 01/22/16 [phone]

## 2018-05-12 ENCOUNTER — Encounter (HOSPITAL_COMMUNITY): Payer: Self-pay | Admitting: Emergency Medicine

## 2018-05-12 ENCOUNTER — Emergency Department (HOSPITAL_COMMUNITY)
Admission: EM | Admit: 2018-05-12 | Discharge: 2018-05-12 | Disposition: A | Discharge status: Home / Self Care | Payer: Medicaid Other | Attending: Emergency Medicine | Admitting: Emergency Medicine

## 2018-05-12 ENCOUNTER — Emergency Department (HOSPITAL_COMMUNITY): Payer: Medicaid Other

## 2018-05-12 ENCOUNTER — Other Ambulatory Visit: Payer: Self-pay

## 2018-05-12 DIAGNOSIS — S0990XA Unspecified injury of head, initial encounter: Secondary | ICD-10-CM | POA: Insufficient documentation

## 2018-05-12 DIAGNOSIS — Y939 Activity, unspecified: Secondary | ICD-10-CM | POA: Diagnosis not present

## 2018-05-12 DIAGNOSIS — F17210 Nicotine dependence, cigarettes, uncomplicated: Secondary | ICD-10-CM | POA: Insufficient documentation

## 2018-05-12 DIAGNOSIS — W109XXA Fall (on) (from) unspecified stairs and steps, initial encounter: Secondary | ICD-10-CM | POA: Diagnosis not present

## 2018-05-12 DIAGNOSIS — S199XXA Unspecified injury of neck, initial encounter: Secondary | ICD-10-CM | POA: Insufficient documentation

## 2018-05-12 DIAGNOSIS — Y999 Unspecified external cause status: Secondary | ICD-10-CM | POA: Insufficient documentation

## 2018-05-12 DIAGNOSIS — M47812 Spondylosis without myelopathy or radiculopathy, cervical region: Secondary | ICD-10-CM | POA: Insufficient documentation

## 2018-05-12 DIAGNOSIS — Y929 Unspecified place or not applicable: Secondary | ICD-10-CM | POA: Diagnosis not present

## 2018-05-12 MED ORDER — HYDROCODONE-ACETAMINOPHEN 5-325 MG PO TABS: 1.0000 | ORAL_TABLET | ORAL | 0 refills | Status: AC | PRN

## 2018-05-12 MED ORDER — HYDROCODONE-ACETAMINOPHEN 5-325 MG PO TABS
1.0000 | ORAL_TABLET | Freq: Once | ORAL | Status: AC
  Administered 2018-05-12: 1 via ORAL
  Filled 2018-05-12: qty 1

## 2018-05-12 NOTE — ED Provider Notes (Signed)
[facility] EMERGENCY DEPARTMENT Provider Note   CSN: [id number] Arrival date & time: 05/12/18  1144     History   Chief Complaint Chief Complaint  Patient presents with  . Fall    HPI George Beltran is a 57 y.o. male.  HPI   Presents for evaluation of injuries from fall.  He states he fell because his right knee gave way while he was going down some steps.  He fell down about 5 steps.  He has ongoing chronic pain in his right knee.  He states that he has chronic arthritis.  He injured his neck in the fall, but does not believe he hurt his head.  Pain in his neck is gradually worse and is felt on both sides of the lower neck.  He denies paresthesia, weakness, chest pain, nausea, vomiting, fever or chills.  There are no other known modifying factors.   Past Medical History:  Diagnosis Date  . Arthritis     There are no active problems to display for this patient.   Past Surgical History:  Procedure Laterality Date  . arm surgery     left  . WRIST SURGERY          Home Medications    Prior to Admission medications   Medication Sig Start Date End Date Taking? Authorizing Provider  HYDROcodone-acetaminophen (NORCO) 5-325 MG tablet Take 1 tablet by mouth every 4 (four) hours as needed for moderate pain. 05/12/18   Mancel Bale, MD    Family History History reviewed. No pertinent family history.  Social History Social History   Tobacco Use  . Smoking status: Current Some Day Smoker    Packs/day: 0.50    Types: Cigarettes  . Smokeless tobacco: Never Used  Substance Use Topics  . Alcohol use: Yes    Comment: occ  . Drug use: No    Types: Cocaine    Comment: former years ago     Allergies   Tylox [oxycodone-acetaminophen]   Review of Systems Review of Systems  All other systems reviewed and are negative.    Physical Exam Updated Vital Signs BP (!) 162/96 (BP Location: Right Arm)   Pulse 84   Temp 98.3 F (36.8 C) (Oral)   Ht 5\' 9"  (1.753 m)    Wt 81.6 kg   SpO2 100%   BMI 26.57 kg/m   Physical Exam  Constitutional: He is oriented to person, place, and time. He appears well-developed and well-nourished. He appears distressed (He is uncomfortable).  HENT:  Head: Normocephalic and atraumatic.  Right Ear: External ear normal.  Left Ear: External ear normal.  Eyes: Pupils are equal, round, and reactive to light. Conjunctivae and EOM are normal.  Neck: Normal range of motion and phonation normal. Neck supple.  Cardiovascular: Normal rate and regular rhythm.  Pulmonary/Chest: Effort normal. He exhibits no tenderness (No crepitation or deformity) and no bony tenderness.  Abdominal: Soft. There is no tenderness.  Musculoskeletal: He exhibits no tenderness or deformity.  He guards against movement of the neck secondary to pain.  There is no posterior step-off.  There is mild bilateral lower neck tenderness.  Neurological: He is alert and oriented to person, place, and time. No cranial nerve deficit or sensory deficit. He exhibits normal muscle tone. Coordination normal.  Skin: Skin is warm, dry and intact.  Psychiatric: He has a normal mood and affect. His behavior is normal. Judgment and thought content normal.  Nursing note and vitals reviewed.    ED  Treatments / Results  Labs (all labs ordered are listed, but only abnormal results are displayed) Labs Reviewed - No data to display  EKG None  Radiology Ct Head Wo Contrast  Result Date: 05/12/2018 CLINICAL DATA:  Per ED note. Patient states he fell 3 days ago and is complaining of of neck and bilateral shoulder pain. States his right knee gave out causing him to fall. Denies LOC. EXAM: CT HEAD WITHOUT CONTRAST CT CERVICAL SPINE WITHOUT CONTRAST TECHNIQUE: Multidetector CT imaging of the head and cervical spine was performed following the standard protocol without intravenous contrast. Multiplanar CT image reconstructions of the cervical spine were also generated. COMPARISON:   None. FINDINGS: CT HEAD FINDINGS Brain: No evidence of acute infarction, hemorrhage, hydrocephalus, extra-axial collection or mass lesion/mass effect. Vascular: No hyperdense vessel or unexpected calcification. Skull: Normal. Negative for fracture or focal lesion. Sinuses/Orbits: No acute finding. Other: None. CT CERVICAL SPINE FINDINGS Alignment: Normal. Skull base and vertebrae: Negative for fracture. No focal bone lesion. Soft tissues and spinal canal: No prevertebral fluid or swelling. No visible canal hematoma. Disc levels: Moderate narrowing of interspaces C3-C7 with anterior and posterior endplate spurring. Disc protrusions centrally C2-3, C3-4. Right posterolateral disc bulges C4-5, C5-6. Small disc bulge C6-7. no severe osseous foraminal stenosis. Upper chest: Negative. Other: Calcified bilateral carotid bifurcation plaque. IMPRESSION: 1. Negative for bleed or other acute intracranial process. 2. Negative for cervical fracture or other acute finding. 3. Cervical spondylitic change C3-C7 as above. 4. Bilateral calcified carotid plaque. Consider elective outpatient ultrasound for quantification of any stenosis. Electronically Signed   By: Corlis Leak M.D.   On: 05/12/2018 12:57   Ct Cervical Spine Wo Contrast  Result Date: 05/12/2018 CLINICAL DATA:  Per ED note. Patient states he fell 3 days ago and is complaining of of neck and bilateral shoulder pain. States his right knee gave out causing him to fall. Denies LOC. EXAM: CT HEAD WITHOUT CONTRAST CT CERVICAL SPINE WITHOUT CONTRAST TECHNIQUE: Multidetector CT imaging of the head and cervical spine was performed following the standard protocol without intravenous contrast. Multiplanar CT image reconstructions of the cervical spine were also generated. COMPARISON:  None. FINDINGS: CT HEAD FINDINGS Brain: No evidence of acute infarction, hemorrhage, hydrocephalus, extra-axial collection or mass lesion/mass effect. Vascular: No hyperdense vessel or unexpected  calcification. Skull: Normal. Negative for fracture or focal lesion. Sinuses/Orbits: No acute finding. Other: None. CT CERVICAL SPINE FINDINGS Alignment: Normal. Skull base and vertebrae: Negative for fracture. No focal bone lesion. Soft tissues and spinal canal: No prevertebral fluid or swelling. No visible canal hematoma. Disc levels: Moderate narrowing of interspaces C3-C7 with anterior and posterior endplate spurring. Disc protrusions centrally C2-3, C3-4. Right posterolateral disc bulges C4-5, C5-6. Small disc bulge C6-7. no severe osseous foraminal stenosis. Upper chest: Negative. Other: Calcified bilateral carotid bifurcation plaque. IMPRESSION: 1. Negative for bleed or other acute intracranial process. 2. Negative for cervical fracture or other acute finding. 3. Cervical spondylitic change C3-C7 as above. 4. Bilateral calcified carotid plaque. Consider elective outpatient ultrasound for quantification of any stenosis. Electronically Signed   By: Corlis Leak M.D.   On: 05/12/2018 12:57   Dg Knee Complete 4 Views Right  Result Date: 05/12/2018 CLINICAL DATA:  Right knee pain after the knee gave out on him and caused him to fall. EXAM: RIGHT KNEE - COMPLETE 4+ VIEW COMPARISON:  Single frontal view dated 05/07/2012 four views dated 04/06/2005. FINDINGS: Mild to moderate medial joint space narrowing and moderate spur formation. Mild lateral spur  formation. Patellofemoral joint space narrowing and mild to moderate spur formation. No effusion is seen. No fracture or dislocation. IMPRESSION: 1. No fracture or dislocation. 2. Tricompartmental degenerative changes. Electronically Signed   By: Beckie SaltsSteven  Reid M.D.   On: 05/12/2018 12:28    Procedures Procedures (including critical care time)  Medications Ordered in ED Medications  HYDROcodone-acetaminophen (NORCO/VICODIN) 5-325 MG per tablet 1 tablet (1 tablet Oral Given 05/12/18 1206)     Initial Impression / Assessment and Plan / ED Course  I have  reviewed the triage vital signs and the nursing notes.  Pertinent labs & imaging results that were available during my care of the patient were reviewed by me and considered in my medical decision making (see chart for details).  Clinical Course as of May 12 1334  Sat May 12, 2018  1325 No fracture or intracranial injury.  Which is reviewed by me  CT Head Wo Contrast [EW]  1325 Degenerative changes, spondylosis, and herniated disc at multiple levels.  Images reviewed by me  CT Cervical Spine Wo Contrast [EW]  1326 Moderate knee arthritis, no fracture.  Images reviewed by me  DG Knee Complete 4 Views Right [EW]    Clinical Course User Index [EW] Mancel BaleWentz, Angeldejesus Callaham, MD     Patient Vitals for the past 24 hrs:  BP Temp Temp src Pulse SpO2 Height Weight  05/12/18 1152 (!) 162/96 98.3 F (36.8 C) Oral 84 100 % - -  05/12/18 1151 - - - - - 5\' 9"  (1.753 m) 81.6 kg    1:26 PM Reevaluation with update and discussion. After initial assessment and treatment, an updated evaluation reveals he is more comfortable at this time.  Cervical collar removed, it had been applied earlier by nursing.  Patient has improved mobility of his neck after narcotic analgesia.  Findings discussed with the patient and his wife, all questions were answered. Mancel BaleElliott Albaraa Swingle   Medical Decision Making: Neck strain, likely aggravated by fall and pre-existing arthritis with disc ruptures.  Doubt cervical myelopathy, occult fracture or discitis.  Right knee arthritis likely, the predisposing condition for fall.  No indication for additional treatment or hospitalization at this time.  CRITICAL CARE-no Performed by: Mancel BaleElliott Kenley Rettinger   Nursing Notes Reviewed/ Care Coordinated Applicable Imaging Reviewed Interpretation of Laboratory Data incorporated into ED treatment  The patient appears reasonably screened and/or stabilized for discharge and I doubt any other medical condition or other [facility] requiring further screening, evaluation,  or treatment in the ED at this time prior to discharge.  Plan: Home Medications-continue routine medications; Home Treatments-heat to affected areas 3 times daily; return here if the recommended treatment, does not improve the symptoms; Recommended follow up-orthopedic follow-up, for ongoing management.     Final Clinical Impressions(s) / ED Diagnoses   Final diagnoses:  Injury of neck, initial encounter  Injury of head, initial encounter  Spondylosis of cervical region without myelopathy or radiculopathy    ED Discharge Orders         Ordered    HYDROcodone-acetaminophen (NORCO) 5-325 MG tablet  Every 4 hours PRN     05/12/18 1333           Mancel BaleWentz, Chauntel Windsor, MD 05/12/18 1335

## 2018-05-12 NOTE — ED Notes (Signed)
Hard c collar applied

## 2018-05-12 NOTE — ED Triage Notes (Signed)
Patient states he fell 3 days ago and is complaining of of neck and bilateral shoulder pain. States his right knee gave out causing him to fall. Denies LOC.

## 2018-05-12 NOTE — Discharge Instructions (Addendum)
The CAT of your neck scan showed that you have moderate arthritis and disc disease.  This likely was aggravated when you fell.  Treatment for that includes rest, and applications with a heating pad frequently.  We are prescribing a narcotic pain reliever to use if needed for severe pain.  You can also try treating the pain with mild pain relievers such as Tylenol or Motrin.  It is important to follow-up with an orthopedic doctor for further care and treatment.  We have given you a referral to Dr. Carola FrostHandy for that.

## 2020-02-16 IMAGING — CT CT HEAD W/O CM
4 of 8 series · 15 of 47 positions shown, 17 images · non-contrast
Comparison: None.

CLINICAL DATA: Per ED note. Patient states he fell 3 days ago and
is complaining of of neck and bilateral shoulder pain. States his
right knee gave out causing him to fall. Denies LOC.

EXAM:
CT HEAD WITHOUT CONTRAST
CT CERVICAL SPINE WITHOUT CONTRAST
TECHNIQUE: Multidetector CT imaging of the head and cervical spine was
performed following the standard protocol without intravenous
contrast. Multiplanar CT image reconstructions of the cervical spine
were also generated.

[Series 4: head bone · axial · 0.44mm/px · z∈[+232,+254]mm · 2 of 78 slices shown]
[im 12/78  bone]
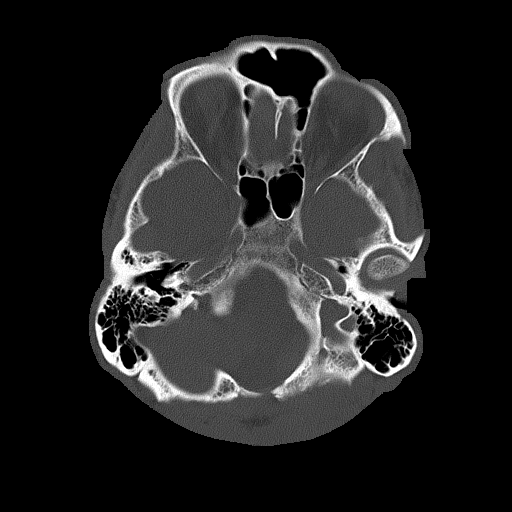
[im 23/78  bone]
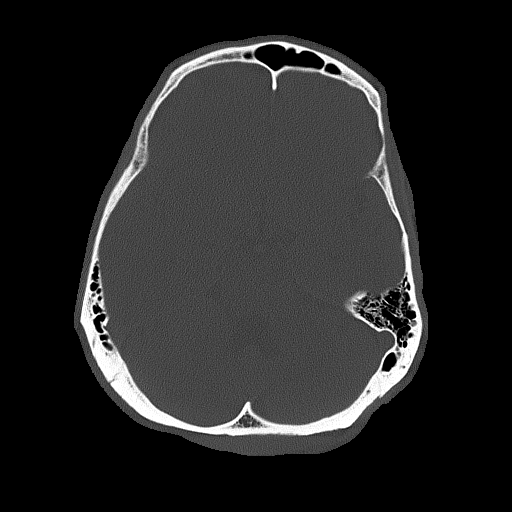

[Series 5: coronal soft tissue · coronal · 0.30mm/px · 3 of 74 slices shown]
[im 21/74  brain]
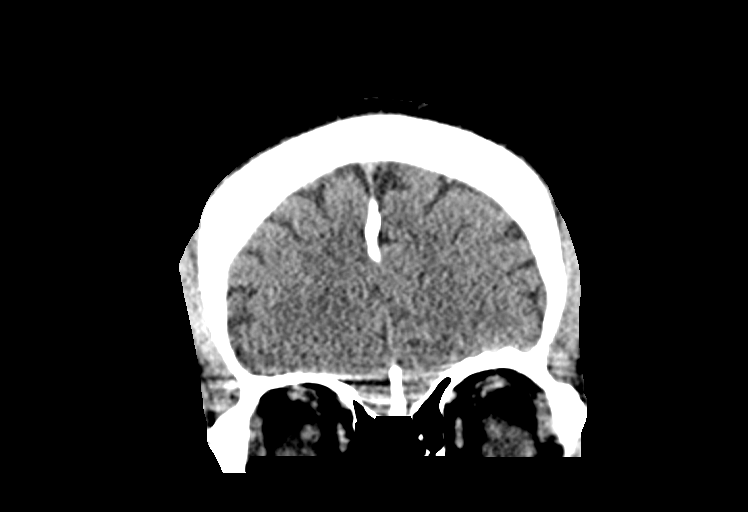
[im 32/74  brain]
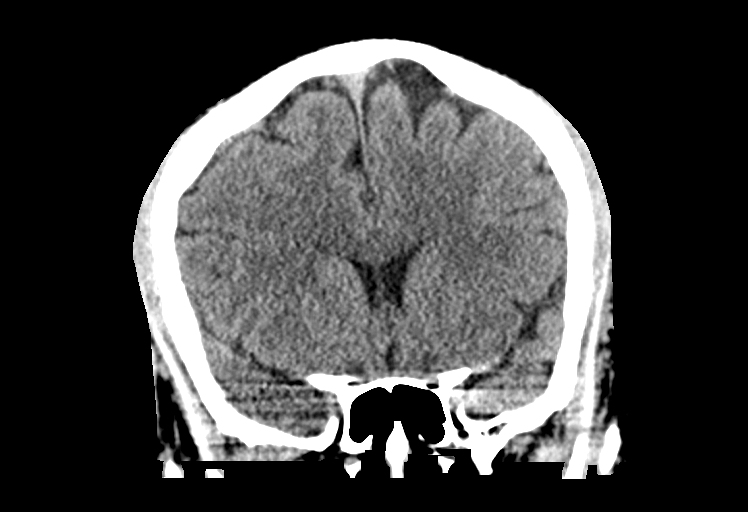
[im 42/74  brain]
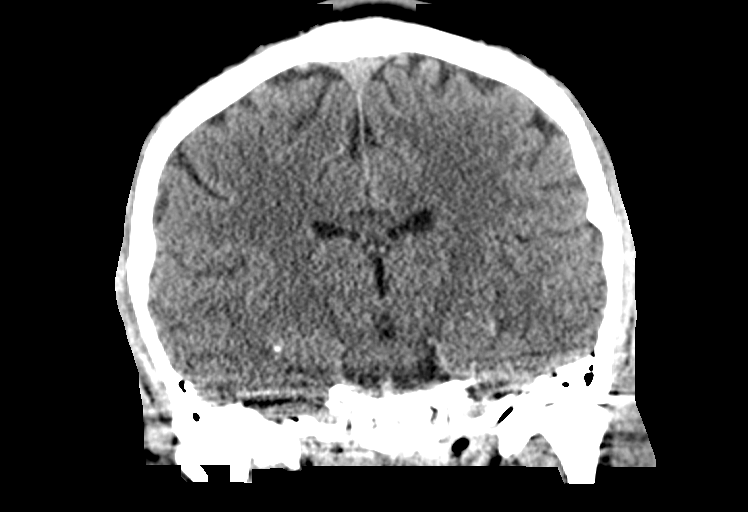

[Series 6: sagittal soft tissue · sagittal · 0.34mm/px · 2 of 65 slices shown]
[im 22/65  brain]
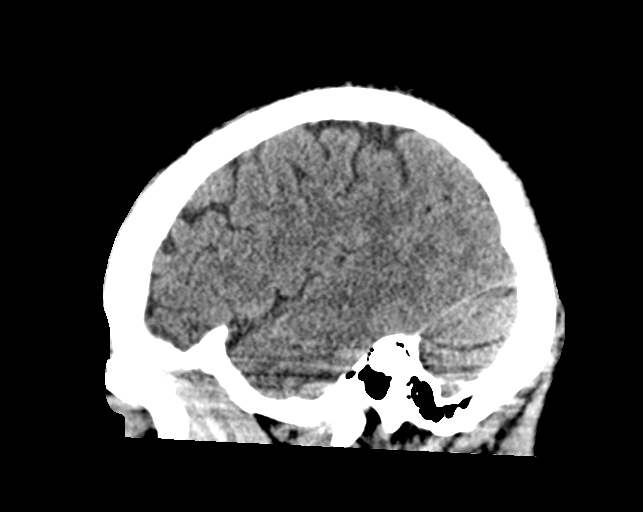
[im 43/65  brain]
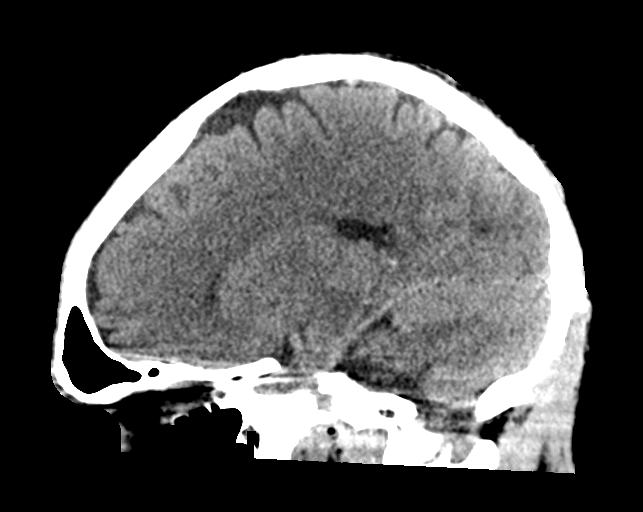

[Series 13: orthogonal bone · axial · 0.21mm/px · z∈[+47,+182]mm · 8 of 98 slices shown, 10 images]
[im 11/98  brain]
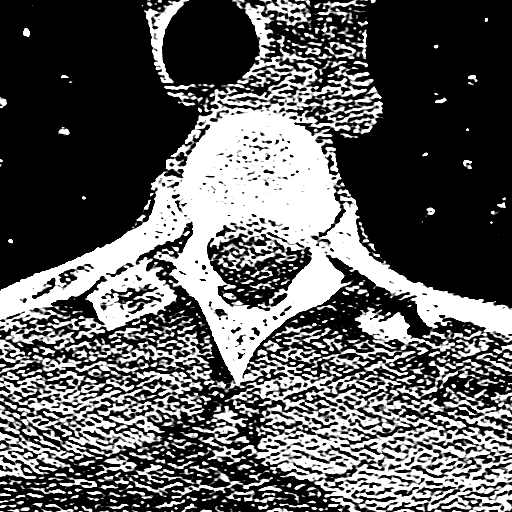
[im 11/98  bone]
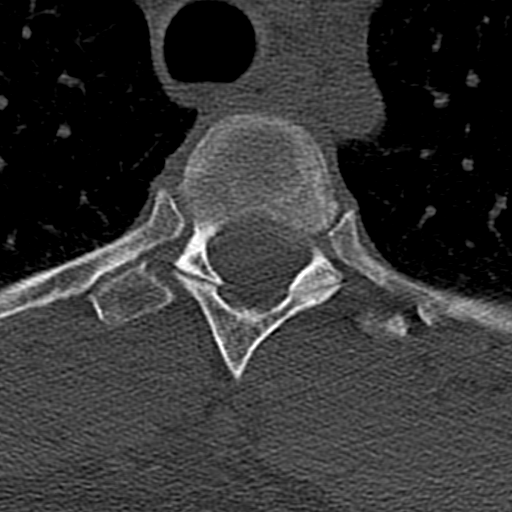
[im 22/98  brain]
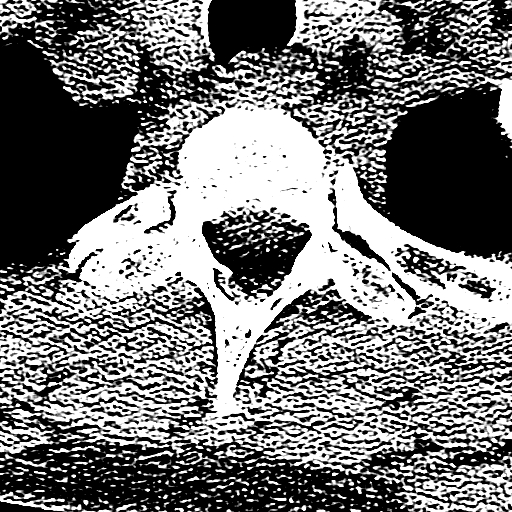
[im 33/98  brain]
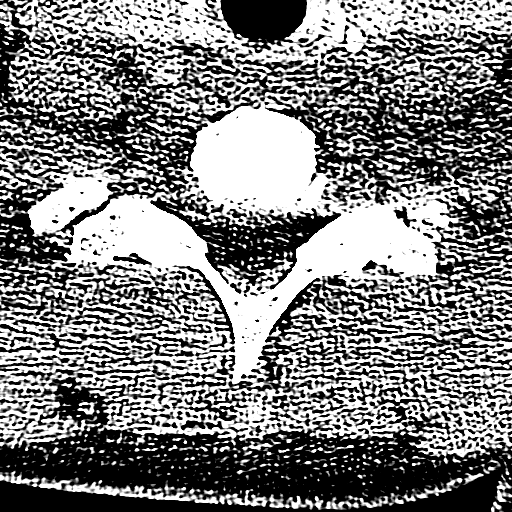
[im 44/98  brain]
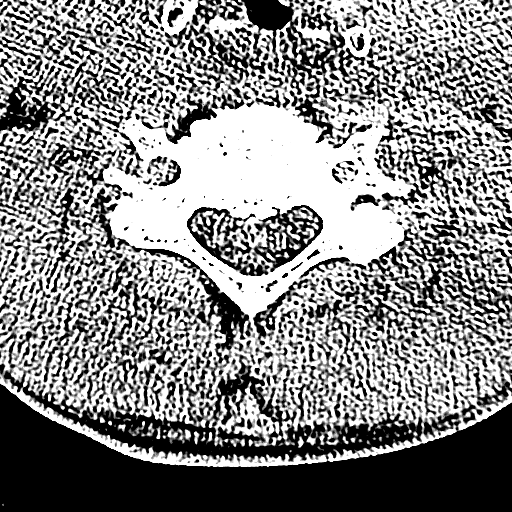
[im 54/98  brain]
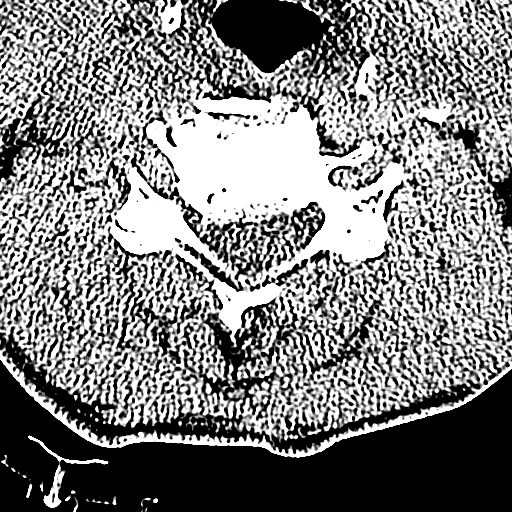
[im 54/98  bone]
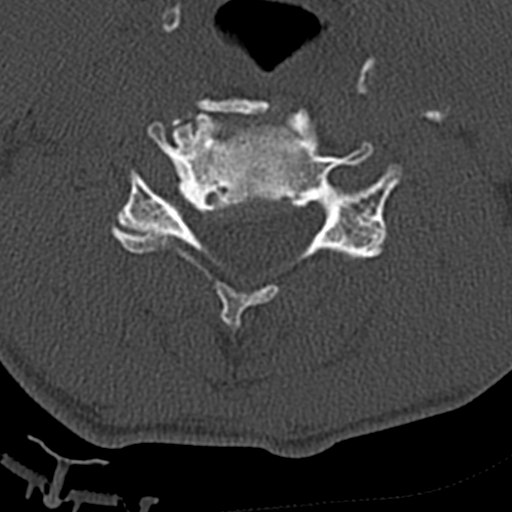
[im 65/98  brain]
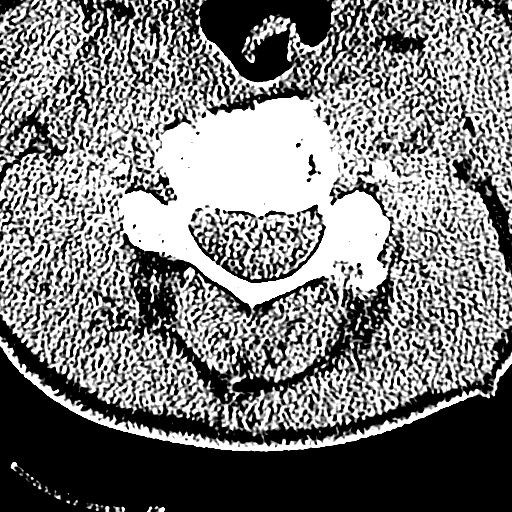
[im 76/98  brain]
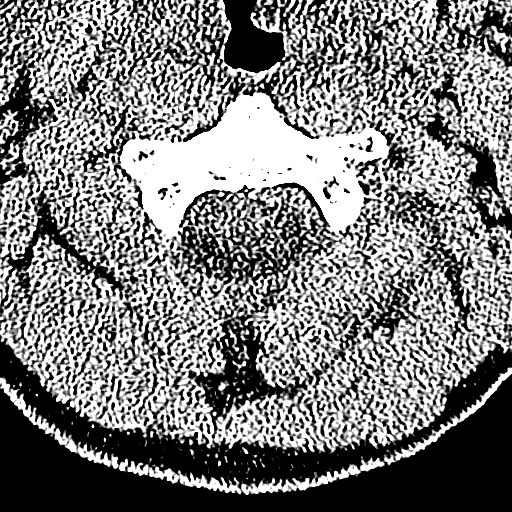
[im 87/98  brain]
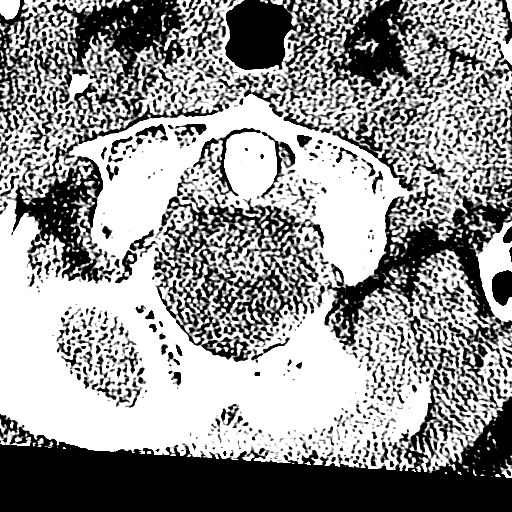

[15 of 47 positions shown; findings below may reference images not displayed]

FINDINGS: CT HEAD FINDINGS

Brain: No evidence of acute infarction, hemorrhage, hydrocephalus,
extra-axial collection or mass lesion/mass effect.

Vascular: No hyperdense vessel or unexpected calcification.

Skull: Normal. Negative for fracture or focal lesion.

Sinuses/Orbits: No acute finding.

Other: None.

CT CERVICAL SPINE FINDINGS

Alignment: Normal.

Skull base and vertebrae: Negative for fracture. No focal bone
lesion.

Soft tissues and spinal canal: No prevertebral fluid or swelling. No
visible canal hematoma.

Disc levels: Moderate narrowing of interspaces C3-C7 with anterior
and posterior endplate spurring. Disc protrusions centrally C2-3,
C3-4. Right posterolateral disc bulges C4-5, C5-6. Small disc bulge
C6-7. no severe osseous foraminal stenosis.

Upper chest: Negative.

Other: Calcified bilateral carotid bifurcation plaque.
IMPRESSION: 1. Negative for bleed or other acute intracranial process.
2. Negative for cervical fracture or other acute finding.
3. Cervical spondylitic change C3-C7 as above.
4. Bilateral calcified carotid plaque. Consider elective outpatient
ultrasound for quantification of any stenosis.

## 2024-01-25 ENCOUNTER — Other Ambulatory Visit: Payer: Self-pay

## 2024-01-25 ENCOUNTER — Emergency Department (HOSPITAL_COMMUNITY)

## 2024-01-25 ENCOUNTER — Observation Stay (HOSPITAL_COMMUNITY)
Admission: EM | Admit: 2024-01-25 | Discharge: 2024-01-26 | Disposition: A | Discharge status: Home / Self Care | Attending: Internal Medicine | Admitting: Internal Medicine

## 2024-01-25 DIAGNOSIS — F1410 Cocaine abuse, uncomplicated: Secondary | ICD-10-CM | POA: Insufficient documentation

## 2024-01-25 DIAGNOSIS — G9341 Metabolic encephalopathy: Secondary | ICD-10-CM | POA: Diagnosis not present

## 2024-01-25 DIAGNOSIS — Z79899 Other long term (current) drug therapy: Secondary | ICD-10-CM | POA: Insufficient documentation

## 2024-01-25 DIAGNOSIS — E872 Acidosis, unspecified: Secondary | ICD-10-CM | POA: Diagnosis not present

## 2024-01-25 DIAGNOSIS — F17210 Nicotine dependence, cigarettes, uncomplicated: Secondary | ICD-10-CM | POA: Insufficient documentation

## 2024-01-25 DIAGNOSIS — R000 Tachycardia, unspecified: Secondary | ICD-10-CM | POA: Diagnosis not present

## 2024-01-25 DIAGNOSIS — N179 Acute kidney failure, unspecified: Secondary | ICD-10-CM | POA: Insufficient documentation

## 2024-01-25 DIAGNOSIS — Z1152 Encounter for screening for COVID-19: Secondary | ICD-10-CM | POA: Insufficient documentation

## 2024-01-25 DIAGNOSIS — R4182 Altered mental status, unspecified: Secondary | ICD-10-CM | POA: Diagnosis present

## 2024-01-25 DIAGNOSIS — T675XXA Heat exhaustion, unspecified, initial encounter: Principal | ICD-10-CM | POA: Insufficient documentation

## 2024-01-25 DIAGNOSIS — F1910 Other psychoactive substance abuse, uncomplicated: Secondary | ICD-10-CM | POA: Insufficient documentation

## 2024-01-25 DIAGNOSIS — X58XXXA Exposure to other specified factors, initial encounter: Secondary | ICD-10-CM | POA: Insufficient documentation

## 2024-01-25 LAB — RAPID URINE DRUG SCREEN, HOSP PERFORMED
Amphetamines: NOT DETECTED
Barbiturates: NOT DETECTED
Benzodiazepines: NOT DETECTED
Cocaine: POSITIVE — AB
Opiates: NOT DETECTED
Tetrahydrocannabinol: NOT DETECTED

## 2024-01-25 LAB — COMPREHENSIVE METABOLIC PANEL WITH GFR
ALT: 22 U/L (ref 0–44)
AST: 42 U/L — ABNORMAL HIGH (ref 15–41)
Albumin: 4.6 g/dL (ref 3.5–5.0)
Alkaline Phosphatase: 74 U/L (ref 38–126)
Anion gap: 18 — ABNORMAL HIGH (ref 5–15)
BUN: 23 mg/dL (ref 8–23)
CO2: 18 mmol/L — ABNORMAL LOW (ref 22–32)
Calcium: 9.3 mg/dL (ref 8.9–10.3)
Chloride: 105 mmol/L (ref 98–111)
Creatinine, Ser: 1.62 mg/dL — ABNORMAL HIGH (ref 0.61–1.24)
GFR, Estimated: 47 mL/min — ABNORMAL LOW (ref >60)
Glucose, Bld: 166 mg/dL — ABNORMAL HIGH (ref 70–99)
Potassium: 3.4 mmol/L — ABNORMAL LOW (ref 3.5–5.1)
Sodium: 141 mmol/L (ref 135–145)
Total Bilirubin: 1.3 mg/dL — ABNORMAL HIGH (ref 0.0–1.2)
Total Protein: 7.9 g/dL (ref 6.5–8.1)

## 2024-01-25 LAB — CBC WITH DIFFERENTIAL/PLATELET
Abs Immature Granulocytes: 0.05 K/uL (ref 0.00–0.07)
Basophils Absolute: 0 K/uL (ref 0.0–0.1)
Basophils Relative: 0 %
Eosinophils Absolute: 0.1 K/uL (ref 0.0–0.5)
Eosinophils Relative: 1 %
HCT: 42.2 % (ref 39.0–52.0)
Hemoglobin: 15 g/dL (ref 13.0–17.0)
Immature Granulocytes: 0 %
Lymphocytes Relative: 11 %
Lymphs Abs: 1.6 K/uL (ref 0.7–4.0)
MCH: 32.8 pg (ref 26.0–34.0)
MCHC: 35.5 g/dL (ref 30.0–36.0)
MCV: 92.3 fL (ref 80.0–100.0)
Monocytes Absolute: 1.1 K/uL — ABNORMAL HIGH (ref 0.1–1.0)
Monocytes Relative: 8 %
Neutro Abs: 11.1 K/uL — ABNORMAL HIGH (ref 1.7–7.7)
Neutrophils Relative %: 80 %
Platelets: 303 K/uL (ref 150–400)
RBC: 4.57 MIL/uL (ref 4.22–5.81)
RDW: 14.2 % (ref 11.5–15.5)
WBC: 13.9 K/uL — ABNORMAL HIGH (ref 4.0–10.5)
nRBC: 0 % (ref 0.0–0.2)

## 2024-01-25 LAB — I-STAT CHEM 8, ED
BUN: 24 mg/dL — ABNORMAL HIGH (ref 8–23)
Calcium, Ion: 1.11 mmol/L — ABNORMAL LOW (ref 1.15–1.40)
Chloride: 107 mmol/L (ref 98–111)
Creatinine, Ser: 1.6 mg/dL — ABNORMAL HIGH (ref 0.61–1.24)
Glucose, Bld: 165 mg/dL — ABNORMAL HIGH (ref 70–99)
HCT: 46 % (ref 39.0–52.0)
Hemoglobin: 15.6 g/dL (ref 13.0–17.0)
Potassium: 3.4 mmol/L — ABNORMAL LOW (ref 3.5–5.1)
Sodium: 141 mmol/L (ref 135–145)
TCO2: 19 mmol/L — ABNORMAL LOW (ref 22–32)

## 2024-01-25 LAB — URINALYSIS, ROUTINE W REFLEX MICROSCOPIC
Bacteria, UA: NONE SEEN
Bilirubin Urine: NEGATIVE
Glucose, UA: NEGATIVE mg/dL
Ketones, ur: 20 mg/dL — AB
Leukocytes,Ua: NEGATIVE
Nitrite: NEGATIVE
Protein, ur: 100 mg/dL — AB
Specific Gravity, Urine: 1.025 (ref 1.005–1.030)
pH: 5 (ref 5.0–8.0)

## 2024-01-25 LAB — CK: Total CK: 549 U/L — ABNORMAL HIGH (ref 49–397)

## 2024-01-25 LAB — RESP PANEL BY RT-PCR (RSV, FLU A&B, COVID)  RVPGX2
Influenza A by PCR: NEGATIVE
Influenza B by PCR: NEGATIVE
Resp Syncytial Virus by PCR: NEGATIVE
SARS Coronavirus 2 by RT PCR: NEGATIVE

## 2024-01-25 LAB — MRSA NEXT GEN BY PCR, NASAL: MRSA by PCR Next Gen: NOT DETECTED

## 2024-01-25 LAB — LACTIC ACID, PLASMA
Lactic Acid, Venous: 5.2 mmol/L (ref 0.5–1.9)
Lactic Acid, Venous: 1.5 mmol/L (ref 0.5–1.9)

## 2024-01-25 LAB — PROTIME-INR
INR: 1.1 (ref 0.8–1.2)
Prothrombin Time: 14.8 s (ref 11.4–15.2)

## 2024-01-25 LAB — TROPONIN I (HIGH SENSITIVITY)
Troponin I (High Sensitivity): 8 ng/L (ref <18)
Troponin I (High Sensitivity): 14 ng/L (ref <18)

## 2024-01-25 LAB — ETHANOL: Alcohol, Ethyl (B): <15 mg/dL (ref <15)

## 2024-01-25 MED ORDER — SODIUM CHLORIDE 0.9 % IV SOLN: 2.0000 g | Freq: Once | INTRAVENOUS | Status: DC

## 2024-01-25 MED ORDER — ENOXAPARIN SODIUM 40 MG/0.4ML IJ SOSY
40.0000 mg | PREFILLED_SYRINGE | INTRAMUSCULAR | Status: DC
  Administered 2024-01-25: 40 mg via SUBCUTANEOUS
  Filled 2024-01-25: qty 0.4

## 2024-01-25 MED ORDER — LACTATED RINGERS IV BOLUS (SEPSIS)
1000.0000 mL | Freq: Once | INTRAVENOUS | Status: AC
  Administered 2024-01-25: 1000 mL via INTRAVENOUS

## 2024-01-25 MED ORDER — DEXTROSE-SODIUM CHLORIDE 5-0.9 % IV SOLN: INTRAVENOUS | Status: DC

## 2024-01-25 MED ORDER — ONDANSETRON HCL 4 MG/2ML IJ SOLN: 4.0000 mg | Freq: Four times a day (QID) | INTRAMUSCULAR | Status: DC | PRN

## 2024-01-25 MED ORDER — POTASSIUM CHLORIDE 10 MEQ/50ML IV SOLN: 10.0000 meq | INTRAVENOUS | Status: DC

## 2024-01-25 MED ORDER — ONDANSETRON HCL 4 MG PO TABS: 4.0000 mg | ORAL_TABLET | Freq: Four times a day (QID) | ORAL | Status: DC | PRN

## 2024-01-25 MED ORDER — HALOPERIDOL LACTATE 5 MG/ML IJ SOLN
5.0000 mg | Freq: Once | INTRAMUSCULAR | Status: DC
  Filled 2024-01-25: qty 1

## 2024-01-25 MED ORDER — ADULT MULTIVITAMIN W/MINERALS CH
1.0000 | ORAL_TABLET | Freq: Every day | ORAL | Status: DC
  Administered 2024-01-26: 1 via ORAL
  Filled 2024-01-25: qty 1

## 2024-01-25 MED ORDER — ACETAMINOPHEN 650 MG RE SUPP: 650.0000 mg | Freq: Four times a day (QID) | RECTAL | Status: DC | PRN

## 2024-01-25 MED ORDER — CHLORHEXIDINE GLUCONATE CLOTH 2 % EX PADS
6.0000 | MEDICATED_PAD | Freq: Every day | CUTANEOUS | Status: DC
  Administered 2024-01-26: 6 via TOPICAL

## 2024-01-25 MED ORDER — ACETAMINOPHEN 325 MG PO TABS: 650.0000 mg | ORAL_TABLET | Freq: Four times a day (QID) | ORAL | Status: DC | PRN

## 2024-01-25 MED ORDER — HYDROCODONE-ACETAMINOPHEN 5-325 MG PO TABS: 1.0000 | ORAL_TABLET | ORAL | Status: DC | PRN

## 2024-01-25 MED ORDER — LACTATED RINGERS IV BOLUS (SEPSIS)
500.0000 mL | Freq: Once | INTRAVENOUS | Status: AC
  Administered 2024-01-25: 500 mL via INTRAVENOUS

## 2024-01-25 MED ORDER — VANCOMYCIN HCL IN DEXTROSE 1-5 GM/200ML-% IV SOLN: 1000.0000 mg | Freq: Once | INTRAVENOUS | Status: DC

## 2024-01-25 MED ORDER — LORAZEPAM 2 MG/ML IJ SOLN
2.0000 mg | Freq: Once | INTRAMUSCULAR | Status: AC
  Administered 2024-01-25: 2 mg via INTRAMUSCULAR
  Filled 2024-01-25: qty 1

## 2024-01-25 MED ORDER — PANTOPRAZOLE SODIUM 40 MG IV SOLR
40.0000 mg | INTRAVENOUS | Status: DC
  Administered 2024-01-25: 40 mg via INTRAVENOUS
  Filled 2024-01-25: qty 10

## 2024-01-25 MED ORDER — THIAMINE HCL 100 MG/ML IJ SOLN
500.0000 mg | Freq: Every day | INTRAVENOUS | Status: DC
  Administered 2024-01-25: 500 mg via INTRAVENOUS
  Filled 2024-01-25 (×2): qty 5

## 2024-01-25 MED ORDER — VANCOMYCIN HCL 1500 MG/300ML IV SOLN
1500.0000 mg | Freq: Once | INTRAVENOUS | Status: AC
  Administered 2024-01-25: 1500 mg via INTRAVENOUS
  Filled 2024-01-25: qty 300

## 2024-01-25 MED ORDER — LACTATED RINGERS IV BOLUS
1000.0000 mL | Freq: Once | INTRAVENOUS | Status: AC
  Administered 2024-01-25: 1000 mL via INTRAVENOUS

## 2024-01-25 MED ORDER — VANCOMYCIN HCL 1500 MG/300ML IV SOLN: 1500.0000 mg | Freq: Once | INTRAVENOUS | Status: DC

## 2024-01-25 MED ORDER — HALOPERIDOL LACTATE 5 MG/ML IJ SOLN
5.0000 mg | Freq: Once | INTRAMUSCULAR | Status: AC
  Administered 2024-01-25: 5 mg via INTRAMUSCULAR

## 2024-01-25 MED ORDER — POTASSIUM CHLORIDE 10 MEQ/100ML IV SOLN
10.0000 meq | INTRAVENOUS | Status: AC
  Administered 2024-01-25 – 2024-01-26 (×4): 10 meq via INTRAVENOUS
  Filled 2024-01-25 (×4): qty 100

## 2024-01-25 MED ORDER — METRONIDAZOLE 500 MG/100ML IV SOLN: 500.0000 mg | Freq: Once | INTRAVENOUS | Status: DC

## 2024-01-25 MED ORDER — METRONIDAZOLE 500 MG/100ML IV SOLN
500.0000 mg | Freq: Once | INTRAVENOUS | Status: AC
  Administered 2024-01-25: 500 mg via INTRAVENOUS
  Filled 2024-01-25: qty 100

## 2024-01-25 MED ORDER — SODIUM CHLORIDE 0.9 % IV SOLN
2.0000 g | Freq: Once | INTRAVENOUS | Status: AC
  Administered 2024-01-25: 2 g via INTRAVENOUS
  Filled 2024-01-25: qty 12.5

## 2024-01-25 NOTE — Assessment & Plan Note (Signed)
 Will need outpatient follow up  Possible behavioral health referral.

## 2024-01-25 NOTE — ED Notes (Signed)
 Pt able to tell me his name, hold a drink and drink it independently, pt is able to say he is at [facility] and that is where he asked the people to bring him.

## 2024-01-25 NOTE — ED Notes (Signed)
 Pt's Sister Merilee responsible for Pts belongings at this time and reports she is taking his clothing home with her at this time to wash them.  If Pt gets admitted, Pts Sister, Merilee Molt requests a phone call and update on room assignment at that time.

## 2024-01-25 NOTE — Assessment & Plan Note (Signed)
 Pre renal renal failure with hypokalemia  Plan to continue IV fluids with isotonic saline and dextrose  Will add 40 meq Kcl IV Follow up renal function and electrolytes, avoid hypotension and nephrotoxic medications

## 2024-01-25 NOTE — ED Provider Notes (Signed)
  EMERGENCY DEPARTMENT AT [facility] Provider Note   CSN: [id number] Arrival date & time: 01/25/24  1301     Patient presents with: Altered Mental Status   George Beltran is a 63 y.o. male.   HPI 63 year old male presents with altered mental status.  Unfortunately the history is limited as the patient is quite altered and I am only able to get history from the nurse who spoke to EMS, who is no longer present.  The patient was reportedly found on the side of the road and EMS asked if he needed help and he said yes.  To me he is only saying Jesus over and over again.  It is unclear how long he has been outside though it is quite warm today.  Current temperature is near 90 degrees.  Otherwise history is very limited.  Prior to Admission medications   Medication Sig Start Date End Date Taking? Authorizing Provider  HYDROcodone -acetaminophen  (NORCO) 5-325 MG tablet Take 1 tablet by mouth every 4 (four) hours as needed for moderate pain. 05/12/18   Lorriane Holmes, MD    Allergies: Tylox [oxycodone -acetaminophen ]    Review of Systems  Unable to perform ROS: Mental status change    Updated Vital Signs BP (!) 100/59   Pulse 70   Temp 98.2 F (36.8 C) (Oral)   Resp 17   Ht 5' 9 (1.753 m)   Wt 82 kg   SpO2 96%   BMI 26.70 kg/m   Physical Exam Vitals and nursing note reviewed.  Constitutional:      Appearance: He is well-developed. He is diaphoretic.  HENT:     Head: Normocephalic and atraumatic.  Eyes:     Extraocular Movements: Extraocular movements intact.     Comments: Rapidly moves eyes in multiple directions looking around  Neck:     Comments: Patient is freely moving his neck back-and-forth left and right Cardiovascular:     Rate and Rhythm: Regular rhythm. Tachycardia present.     Pulses:          Radial pulses are 2+ on the right side.     Heart sounds: Normal heart sounds.  Pulmonary:     Effort: Pulmonary effort is normal.     Breath  sounds: Normal breath sounds.  Abdominal:     Palpations: Abdomen is soft.  Skin:    General: Skin is warm.  Neurological:     Mental Status: He is alert.     Comments: Patient moves all 4 extremities but is altered and agitated.     (all labs ordered are listed, but only abnormal results are displayed) Labs Reviewed  CK - Abnormal; Notable for the following components:      Result Value   Total CK 549 (*)    All other components within normal limits  COMPREHENSIVE METABOLIC PANEL WITH GFR - Abnormal; Notable for the following components:   Potassium 3.4 (*)    CO2 18 (*)    Glucose, Bld 166 (*)    Creatinine, Ser 1.62 (*)    AST 42 (*)    Total Bilirubin 1.3 (*)    GFR, Estimated 47 (*)    Anion gap 18 (*)    All other components within normal limits  CBC WITH DIFFERENTIAL/PLATELET - Abnormal; Notable for the following components:   WBC 13.9 (*)    Neutro Abs 11.1 (*)    Monocytes Absolute 1.1 (*)    All other components within normal limits  LACTIC  ACID, PLASMA - Abnormal; Notable for the following components:   Lactic Acid, Venous 5.2 (*)    All other components within normal limits  I-STAT CHEM 8, ED - Abnormal; Notable for the following components:   Potassium 3.4 (*)    BUN 24 (*)    Creatinine, Ser 1.60 (*)    Glucose, Bld 165 (*)    Calcium, Ion 1.11 (*)    TCO2 19 (*)    All other components within normal limits  CULTURE, BLOOD (ROUTINE X 2)  RESP PANEL BY RT-PCR (RSV, FLU A&B, COVID)  RVPGX2  CULTURE, BLOOD (ROUTINE X 2) W REFLEX TO ID PANEL  ETHANOL  PROTIME-INR  RAPID URINE DRUG SCREEN, HOSP PERFORMED  URINALYSIS, ROUTINE W REFLEX MICROSCOPIC  LACTIC ACID, PLASMA  CBG MONITORING, ED  CBG MONITORING, ED  TROPONIN I (HIGH SENSITIVITY)  TROPONIN I (HIGH SENSITIVITY)    EKG: None  Radiology: CT Head Wo Contrast Result Date: 01/25/2024 CLINICAL DATA:  Mental status change, unknown cause EXAM: CT HEAD WITHOUT CONTRAST TECHNIQUE: Contiguous axial  images were obtained from the base of the skull through the vertex without intravenous contrast. RADIATION DOSE REDUCTION: This exam was performed according to the departmental dose-optimization program which includes automated exposure control, adjustment of the mA and/or kV according to patient size and/or use of iterative reconstruction technique. COMPARISON:  May 12, 2018 FINDINGS: Brain: The ventricles appear age appropriate. No mass effect or midline shift. Gray-white differentiation is preserved without focal attenuation abnormality.No evidence of acute territorial infarction, extra-axial fluid collection, hemorrhage, or mass lesion. The basilar cisterns are patent without downward herniation. The cerebellar hemispheres and vermis are well formed without mass lesion or focal attenuation abnormality. Vascular: No hyperdense vessel. Skull: Normal. Negative for fracture or focal lesion. Sinuses/Orbits: The paranasal sinuses and mastoids are clear.The globes appear intact. No retrobulbar hematoma. Other: None. IMPRESSION: No acute intracranial abnormality, specifically, no acute hemorrhage, territorial infarction, or intracranial mass. Electronically Signed   By: Rogelia Myers M.D.   On: 01/25/2024 15:22   DG Chest Portable 1 View Result Date: 01/25/2024 CLINICAL DATA:  Altered mental status. EXAM: PORTABLE CHEST 1 VIEW COMPARISON:  None Available. FINDINGS: Low lung volume. Bilateral lung fields are clear. No dense consolidation or lung collapse. Bilateral costophrenic angles are clear. Normal cardio-mediastinal silhouette. No acute osseous abnormalities. The soft tissues are within normal limits. IMPRESSION: No active disease. Electronically Signed   By: Ree Molt M.D.   On: 01/25/2024 13:54     .Critical Care  Performed by: Freddi Hamilton, MD Authorized by: Freddi Hamilton, MD   Critical care provider statement:    Critical care time (minutes):  45   Critical care time was exclusive  of:  Separately billable procedures and treating other patients   Critical care was necessary to treat or prevent imminent or life-threatening deterioration of the following conditions:  CNS failure or compromise, sepsis and shock   Critical care was time spent personally by me on the following activities:  Development of treatment plan with patient or surrogate, discussions with consultants, evaluation of patient's response to treatment, examination of patient, ordering and review of laboratory studies, ordering and review of radiographic studies, ordering and performing treatments and interventions, pulse oximetry, re-evaluation of patient's condition and review of old charts    Medications Ordered in the ED  haloperidol  lactate (HALDOL ) injection 5 mg (5 mg Intravenous Not Given 01/25/24 1318)  metroNIDAZOLE  (FLAGYL ) IVPB 500 mg (500 mg Intravenous New Bag/Given 01/25/24 1603)  vancomycin  (VANCOREADY) IVPB 1500 mg/300 mL (has no administration in time range)  lactated ringers  bolus 1,000 mL (1,000 mLs Intravenous New Bag/Given 01/25/24 1600)    And  lactated ringers  bolus 500 mL (500 mLs Intravenous New Bag/Given 01/25/24 1601)  LORazepam  (ATIVAN ) injection 2 mg (2 mg Intramuscular Given 01/25/24 1318)  haloperidol  lactate (HALDOL ) injection 5 mg (5 mg Intramuscular Given 01/25/24 1321)  lactated ringers  bolus 1,000 mL (0 mLs Intravenous Stopped 01/25/24 1447)  ceFEPIme  (MAXIPIME ) 2 g in sodium chloride  0.9 % 100 mL IVPB (0 g Intravenous Stopped 01/25/24 1518)    Clinical Course as of 01/25/24 1606  Thu Jan 25, 2024  1359 On reassessment, patient is now sitting up calmly, speaking clearly, and drinking water.  He states he suspects that he was slipped some drugs.  He states he used cocaine yesterday.  He is now completely alert and oriented to person, place, time.  I think this is most likely a heat related illness, and I think sepsis is much less likely. [SG]  1437 After reconsidering, I think that  heat related illness is the most likely cause of his presentation today along with drugs, but it is still unclear and needs to be sorted out.  Thus for now we will put him on antibiotics as we continue the workup. [SG]    Clinical Course User Index [SG] Freddi Hamilton, MD                                 Medical Decision Making Amount and/or Complexity of Data Reviewed Labs: ordered. Radiology: ordered.  Risk Prescription drug management.   Patient presents with altered mental status.  He is only repeating one-word.  He was given Haldol  and Ativan  to help control his acute agitation.  After this, but also more likely after ice packs and improving his temperature, he now has been normal mental status.  At no time has he seemed to have meningismus.  I suspect this is more of a heat related illness though when family has arrived they are also concerned about recurrent cocaine abuse.  He has been given IV fluids.  His lactate is quite elevated, and given that there is certainly some uncertainty I have also continued antibiotics as above.  Will continue fluids and currently are waiting on urine tests.  Care transferred to Dr. Zammit.     Final diagnoses:  Heat exhaustion, initial encounter  Acute kidney injury (HCC)  Lactic acidosis    ED Discharge Orders     None          Freddi Hamilton, MD 01/25/24 [phone]

## 2024-01-25 NOTE — Progress Notes (Signed)
 Elink is following code sepsis.

## 2024-01-25 NOTE — Discharge Instructions (Signed)

## 2024-01-25 NOTE — ED Triage Notes (Signed)
 Pt bib [facility]. Pt was found by EMS walking in the streets. Pt looked disoriented so EMS stopped to ask patient if he needed help. Patient yelled out to EMS he needed the hospital and he needed jesus. [employer] brought pt in for EMS d/t him being so disoriented and not getting in the back of EMS truck. Pt is profusely sweating and yelling out jesus help me

## 2024-01-25 NOTE — ED Notes (Signed)
 CBG 168.

## 2024-01-25 NOTE — ED Notes (Signed)
 George Beltran requesting Auston Burnet no calls or visits.

## 2024-01-25 NOTE — Assessment & Plan Note (Addendum)
 Patient with acute metabolic and toxic encephalopathy with not complete recovery Likely multifactorial due to cocaine intoxication and dehydration.   Plan to continue supportive medical care Close neuro checks and aspiration precautions.  IV thiamine  and oral multivitamins.  Stepdown for now

## 2024-01-25 NOTE — H&P (Signed)
 History and Physical    Patient: George Beltran FMW:[id number] DOB: 1960-08-01 DOA: 01/25/2024 DOS: the patient was seen and examined on 01/25/2024 PCP: Patient, No Pcp Per  Patient coming from: Home  Chief Complaint:  Chief Complaint  Patient presents with   Altered Mental Status   HPI: [name] is a 63 y.o. male with medical history significant of arthritis who was brought to the ED due to altered mental status.  At the time of my examination he is not able to provide any history due to cognitive impairment. All information from the medical record.  Apparently he was found walking in the streets, disorientated and agitated. He was offered help but became aggressive. Police was called at the site and finally patient was brought to the ED by EMS.  He was noted to have severe diaphoresis, and having incoherent speech. It is unclear for who long patient has been outdoors under extreme high temperatures.  In the ED patient required haloperidol   and lorazepam . Per ED noted he recovered his cognition and was able to admit using illicit drugs, including cocaine.   During his stay in the ED he had recurrent somnolence and became less reactive, prompting his admission for further observation.   Review of Systems: unable to review all systems due to the inability of the patient to answer questions. Past Medical History:  Diagnosis Date   Arthritis    Past Surgical History:  Procedure Laterality Date   arm surgery     left   WRIST SURGERY     Social History:  reports that he has been smoking cigarettes. He has never used smokeless tobacco. He reports current alcohol use. He reports that he does not use drugs.  Allergies  Allergen Reactions   Tylox [Oxycodone -Acetaminophen ] Itching    No family history on file.  Prior to Admission medications   Medication Sig Start Date End Date Taking? Authorizing Provider  HYDROcodone -acetaminophen  (NORCO) 5-325 MG tablet Take 1 tablet by mouth  every 4 (four) hours as needed for moderate pain. 05/12/18   Lorriane Holmes, MD    Physical Exam: Vitals:   01/25/24 1411 01/25/24 1411 01/25/24 1500 01/25/24 1530  BP:   105/63 (!) 100/59  Pulse:   72 70  Resp:   16 17  Temp: 99.5 F (37.5 C) 98.2 F (36.8 C)    TempSrc: Axillary Oral    SpO2:   95% 96%  Weight:      Height:       BP (!) 100/59   Pulse 70   Temp (!) 96.9 F (36.1 C) (Axillary)   Resp 17   Ht 5' 9 (1.753 m)   Wt 82 kg   SpO2 96%   BMI 26.70 kg/m   Neurology patient with eyes closed, responds to touch opening eyes but not able to answer questions of follow commands. Limited further neuro exam ENT with mild pallor, oral mucosa dry  Cardiovascular with S1 and S2 present and regular with no gallops, rubs or murmurs Respiratory with no rales or wheezing, no rhonchi  Abdomen with no distention, non tender to palpation, soft No lower extremity edema No evident rashes on limited skin evaluation   Data Reviewed:   Na 141, K 3,4 Cl 107 , glucose 165, bun 24 cr 1,60  High sensitive troponin 14 Lactic acid 1,5  Hgb 15.6  Urine analysis SG 1,025, protein 100, negative leukocytes and negative hgb  Toxicology positive for cocaine  Alcohol < 15   CT  head with no acute intracranial abnormalities, no acute hemorrhage, territorial infarct or intracranial mass.   Chest radiograph with hypoinflation with cardiomegaly, with no infiltrates, or effusions.   EKG 73 bpm, normal axis, normal intervals, qtc 491, sinus rhythm with no significant ST segment or T wave changes.   Assessment and Plan: * Acute metabolic encephalopathy Patient with acute metabolic and toxic encephalopathy with not complete recovery Likely multifactorial due to cocaine intoxication and dehydration.   Plan to continue supportive medical care Close neuro checks and aspiration precautions.  IV thiamine  and oral multivitamins.  Stepdown for now   AKI (acute kidney injury) (HCC) Pre renal  renal failure with hypokalemia  Plan to continue IV fluids with isotonic saline and dextrose  Will add 40 meq Kcl IV Follow up renal function and electrolytes, avoid hypotension and nephrotoxic medications   Substance abuse (HCC) Will need outpatient follow up  Possible behavioral health referral.    Advance Care Planning:   Code Status: Full Code   Consults: none   Family Communication: no family at the bedside   Severity of Illness: The appropriate patient status for this patient is INPATIENT. Inpatient status is judged to be reasonable and necessary in order to provide the required intensity of service to ensure the patient's safety. The patient's presenting symptoms, physical exam findings, and initial radiographic and laboratory data in the context of their chronic comorbidities is felt to place them at high risk for further clinical deterioration. Furthermore, it is not anticipated that the patient will be medically stable for discharge from the hospital within 2 midnights of admission.   * I certify that at the point of admission it is my clinical judgment that the patient will require inpatient hospital care spanning beyond 2 midnights from the point of admission due to high intensity of service, high risk for further deterioration and high frequency of surveillance required.*  Author: Elidia Toribio Furnace, MD 01/25/2024 6:25 PM  For on call review [email or web address].

## 2024-01-26 DIAGNOSIS — G9341 Metabolic encephalopathy: Secondary | ICD-10-CM | POA: Diagnosis not present

## 2024-01-26 LAB — CBC
HCT: 36.7 % — ABNORMAL LOW (ref 39.0–52.0)
Hemoglobin: 12.4 g/dL — ABNORMAL LOW (ref 13.0–17.0)
MCH: 32.1 pg (ref 26.0–34.0)
MCHC: 33.8 g/dL (ref 30.0–36.0)
MCV: 95.1 fL (ref 80.0–100.0)
Platelets: 249 K/uL (ref 150–400)
RBC: 3.86 MIL/uL — ABNORMAL LOW (ref 4.22–5.81)
RDW: 14.6 % (ref 11.5–15.5)
WBC: 9.1 K/uL (ref 4.0–10.5)
nRBC: 0 % (ref 0.0–0.2)

## 2024-01-26 LAB — BASIC METABOLIC PANEL WITH GFR
Anion gap: 10 (ref 5–15)
BUN: 17 mg/dL (ref 8–23)
CO2: 21 mmol/L — ABNORMAL LOW (ref 22–32)
Calcium: 8.2 mg/dL — ABNORMAL LOW (ref 8.9–10.3)
Chloride: 108 mmol/L (ref 98–111)
Creatinine, Ser: 0.96 mg/dL (ref 0.61–1.24)
GFR, Estimated: >60 mL/min (ref >60)
Glucose, Bld: 86 mg/dL (ref 70–99)
Potassium: 4 mmol/L (ref 3.5–5.1)
Sodium: 139 mmol/L (ref 135–145)

## 2024-01-26 LAB — HIV ANTIBODY (ROUTINE TESTING W REFLEX): HIV Screen 4th Generation wRfx: NONREACTIVE

## 2024-01-26 NOTE — Discharge Summary (Signed)
 Physician Discharge Summary  George Beltran FMW:[id number] DOB: 1961/05/09 DOA: 01/25/2024  PCP: Patient, No Pcp Per  Admit date: 01/25/2024  Discharge date: 01/26/2024  Admitted From:Home  Disposition:  Home  Recommendations for Outpatient Follow-up:  Follow up with PCP in 1-2 weeks Follow-up lab work in 1 week with no new medications to be prescribed  Home Health: None  Equipment/Devices: None   Discharge Condition:Stable  CODE STATUS: Full  Diet recommendation: Regular  Brief/Interim Summary: George Beltran is a 63 y.o. male with medical history significant of arthritis who was brought to the ED due to altered mental status.  At the time of my examination he is not able to provide any history due to cognitive impairment. All information from the medical record.   Apparently he was found walking in the streets, disorientated and agitated. He was offered help but became aggressive. Police was called at the site and finally patient was brought to the ED by EMS.  He was noted to have severe diaphoresis, and having incoherent speech. It is unclear for who long patient has been outdoors under extreme high temperatures.  In the ED patient required haloperidol   and lorazepam . Per ED noted he recovered his cognition and was able to admit using illicit drugs, including cocaine.   Patient was admitted with acute metabolic encephalopathy in the setting of dehydration with AKI along with cocaine intoxication.  He is now back to baseline and AKI has resolved.  He is overall in stable condition for discharge at this point with no other acute events or concerns noted.  Discharge Diagnoses:  Principal Problem:   Acute metabolic encephalopathy Active Problems:   AKI (acute kidney injury) (HCC)   Substance abuse (HCC)  Principal discharge diagnosis: Acute metabolic encephalopathy-multifactorial with cocaine intoxication as well as AKI with noted dehydration and hypokalemia.  Discharge  Instructions  Discharge Instructions     Diet - low sodium heart healthy   Complete by: As directed    Increase activity slowly   Complete by: As directed       Allergies as of 01/26/2024       Reactions   Tylox [oxycodone -acetaminophen ] Itching        Medication List     TAKE these medications    HYDROcodone -acetaminophen  5-325 MG tablet Commonly known as: Norco Take 1 tablet by mouth every 4 (four) hours as needed for moderate pain.        Allergies  Allergen Reactions   Tylox [Oxycodone -Acetaminophen ] Itching    Consultations: None   Procedures/Studies: CT Head Wo Contrast Result Date: 01/25/2024 CLINICAL DATA:  Mental status change, unknown cause EXAM: CT HEAD WITHOUT CONTRAST TECHNIQUE: Contiguous axial images were obtained from the base of the skull through the vertex without intravenous contrast. RADIATION DOSE REDUCTION: This exam was performed according to the departmental dose-optimization program which includes automated exposure control, adjustment of the mA and/or kV according to patient size and/or use of iterative reconstruction technique. COMPARISON:  May 12, 2018 FINDINGS: Brain: The ventricles appear age appropriate. No mass effect or midline shift. Gray-white differentiation is preserved without focal attenuation abnormality.No evidence of acute territorial infarction, extra-axial fluid collection, hemorrhage, or mass lesion. The basilar cisterns are patent without downward herniation. The cerebellar hemispheres and vermis are well formed without mass lesion or focal attenuation abnormality. Vascular: No hyperdense vessel. Skull: Normal. Negative for fracture or focal lesion. Sinuses/Orbits: The paranasal sinuses and mastoids are clear.The globes appear intact. No retrobulbar hematoma. Other: None. IMPRESSION: No acute intracranial  abnormality, specifically, no acute hemorrhage, territorial infarction, or intracranial mass. Electronically Signed   By:  Rogelia Myers M.D.   On: 01/25/2024 15:22   DG Chest Portable 1 View Result Date: 01/25/2024 CLINICAL DATA:  Altered mental status. EXAM: PORTABLE CHEST 1 VIEW COMPARISON:  None Available. FINDINGS: Low lung volume. Bilateral lung fields are clear. No dense consolidation or lung collapse. Bilateral costophrenic angles are clear. Normal cardio-mediastinal silhouette. No acute osseous abnormalities. The soft tissues are within normal limits. IMPRESSION: No active disease. Electronically Signed   By: Ree Molt M.D.   On: 01/25/2024 13:54     Discharge Exam: Vitals:   01/26/24 0600 01/26/24 0749  BP: 127/80   Pulse: 62   Resp: 14   Temp:  98 F (36.7 C)  SpO2: 99%    Vitals:   01/26/24 0433 01/26/24 0500 01/26/24 0600 01/26/24 0749  BP:  129/80 127/80   Pulse:  (!) 59 62   Resp:  15 14   Temp: (!) 97.4 F (36.3 C)   98 F (36.7 C)  TempSrc: Axillary   Oral  SpO2:  98% 99%   Weight:      Height:        General: Pt is alert, awake, not in acute distress Cardiovascular: RRR, S1/S2 +, no rubs, no gallops Respiratory: CTA bilaterally, no wheezing, no rhonchi Abdominal: Soft, NT, ND, bowel sounds + Extremities: no edema, no cyanosis    The results of significant diagnostics from this hospitalization (including imaging, microbiology, ancillary and laboratory) are listed below for reference.     Microbiology: Recent Results (from the past 240 hours)  Resp panel by RT-PCR (RSV, Flu A&B, Covid) Anterior Nasal Swab     Status: None   Collection Time: 01/25/24  1:30 PM   Specimen: Anterior Nasal Swab  Result Value Ref Range Status   SARS Coronavirus 2 by RT PCR NEGATIVE NEGATIVE Final    Comment: (NOTE) SARS-CoV-2 target nucleic acids are NOT DETECTED.  The SARS-CoV-2 RNA is generally detectable in upper respiratory specimens during the acute phase of infection. The lowest concentration of SARS-CoV-2 viral copies this assay can detect is 138 copies/mL. A negative result  does not preclude SARS-Cov-2 infection and should not be used as the sole basis for treatment or other patient management decisions. A negative result may occur with  improper specimen collection/handling, submission of specimen other than nasopharyngeal swab, presence of viral mutation(s) within the areas targeted by this assay, and inadequate number of viral copies(<138 copies/mL). A negative result must be combined with clinical observations, patient history, and epidemiological information. The expected result is Negative.  Fact Sheet for Patients:  BloggerCourse.com  Fact Sheet for Healthcare Providers:  SeriousBroker.it  This test is no t yet approved or cleared by the [place]  FDA and  has been authorized for detection and/or diagnosis of SARS-CoV-2 by FDA under an Emergency Use Authorization (EUA). This EUA will remain  in effect (meaning this test can be used) for the duration of the COVID-19 declaration under Section 564(b)(1) of the Act, 21 U.S.C.section 360bbb-3(b)(1), unless the authorization is terminated  or revoked sooner.       Influenza A by PCR NEGATIVE NEGATIVE Final   Influenza B by PCR NEGATIVE NEGATIVE Final    Comment: (NOTE) The Xpert Xpress SARS-CoV-2/FLU/RSV plus assay is intended as an aid in the diagnosis of influenza from Nasopharyngeal swab specimens and should not be used as a sole basis for treatment. Nasal washings and aspirates  are unacceptable for Xpert Xpress SARS-CoV-2/FLU/RSV testing.  Fact Sheet for Patients: BloggerCourse.com  Fact Sheet for Healthcare Providers: SeriousBroker.it  This test is not yet approved or cleared by the [place]  FDA and has been authorized for detection and/or diagnosis of SARS-CoV-2 by FDA under an Emergency Use Authorization (EUA). This EUA will remain in effect (meaning this test can be used) for  the duration of the COVID-19 declaration under Section 564(b)(1) of the Act, 21 U.S.C. section 360bbb-3(b)(1), unless the authorization is terminated or revoked.     Resp Syncytial Virus by PCR NEGATIVE NEGATIVE Final    Comment: (NOTE) Fact Sheet for Patients: BloggerCourse.com  Fact Sheet for Healthcare Providers: SeriousBroker.it  This test is not yet approved or cleared by the [place]  FDA and has been authorized for detection and/or diagnosis of SARS-CoV-2 by FDA under an Emergency Use Authorization (EUA). This EUA will remain in effect (meaning this test can be used) for the duration of the COVID-19 declaration under Section 564(b)(1) of the Act, 21 U.S.C. section 360bbb-3(b)(1), unless the authorization is terminated or revoked.  Performed at [facility], [address] [place], [place] [address]   Culture, blood (Routine X 2) w Reflex to ID Panel     Status: None (Preliminary result)   Collection Time: 01/25/24  1:46 PM   Specimen: BLOOD LEFT FOREARM  Result Value Ref Range Status   Specimen Description   Final    BLOOD LEFT FOREARM BOTTLES DRAWN AEROBIC AND ANAEROBIC   Special Requests   Final    Blood Culture results may not be optimal due to an inadequate volume of blood received in culture bottles   Culture   Final    NO GROWTH < 24 HOURS Performed at [facility], [address], [place], [place] [address]    Report Status PENDING  Incomplete  Culture, blood (routine x 2)     Status: None (Preliminary result)   Collection Time: 01/25/24  1:47 PM   Specimen: BLOOD LEFT HAND  Result Value Ref Range Status   Specimen Description BLOOD LEFT HAND BOTTLES DRAWN AEROBIC ONLY  Final   Special Requests Blood Culture adequate volume  Final   Culture   Final    NO GROWTH < 24 HOURS Performed at [facility], [address], [place], [place] [address]    Report Status PENDING  Incomplete  MRSA Next Gen by PCR, Nasal      Status: None   Collection Time: 01/25/24  7:12 PM   Specimen: Nasal Mucosa; Nasal Swab  Result Value Ref Range Status   MRSA by PCR Next Gen NOT DETECTED NOT DETECTED Final    Comment: (NOTE) The GeneXpert MRSA Assay (FDA approved for NASAL specimens only), is one component of a comprehensive MRSA colonization surveillance program. It is not intended to diagnose MRSA infection nor to guide or monitor treatment for MRSA infections. Test performance is not FDA approved in patients less than 51 years old. Performed at [facility], [address], [place], [place] [address]      Labs: BNP (last 3 results) No results for input(s): BNP in the last 8760 hours. Basic Metabolic Panel: Recent Labs  Lab 01/25/24 1337 01/25/24 1405 01/26/24 0352  NA 141 141 139  K 3.4* 3.4* 4.0  CL 105 107 108  CO2 18*  --  21*  GLUCOSE 166* 165* 86  BUN 23 24* 17  CREATININE 1.62* 1.60* 0.96  CALCIUM 9.3  --  8.2*   Liver  Function Tests: Recent Labs  Lab 01/25/24 1337  AST 42*  ALT 22  ALKPHOS 74  BILITOT 1.3*  PROT 7.9  ALBUMIN 4.6   No results for input(s): LIPASE, AMYLASE in the last 168 hours. No results for input(s): AMMONIA in the last 168 hours. CBC: Recent Labs  Lab 01/25/24 1337 01/25/24 1405 01/26/24 0352  WBC 13.9*  --  9.1  NEUTROABS 11.1*  --   --   HGB 15.0 15.6 12.4*  HCT 42.2 46.0 36.7*  MCV 92.3  --  95.1  PLT 303  --  249   Cardiac Enzymes: Recent Labs  Lab 01/25/24 1337  CKTOTAL 549*   BNP: Invalid input(s): POCBNP CBG: No results for input(s): GLUCAP in the last 168 hours. D-Dimer No results for input(s): DDIMER in the last 72 hours. Hgb A1c No results for input(s): HGBA1C in the last 72 hours. Lipid Profile No results for input(s): CHOL, HDL, LDLCALC, TRIG, CHOLHDL, LDLDIRECT in the last 72 hours. Thyroid function studies No results for input(s): TSH, T4TOTAL, T3FREE, THYROIDAB in the last 72  hours.  Invalid input(s): FREET3 Anemia work up No results for input(s): VITAMINB12, FOLATE, FERRITIN, TIBC, IRON, RETICCTPCT in the last 72 hours. Urinalysis    Component Value Date/Time   COLORURINE YELLOW 01/25/2024 1658   APPEARANCEUR HAZY (A) 01/25/2024 1658   LABSPEC 1.025 01/25/2024 1658   PHURINE 5.0 01/25/2024 1658   GLUCOSEU NEGATIVE 01/25/2024 1658   HGBUR SMALL (A) 01/25/2024 1658   BILIRUBINUR NEGATIVE 01/25/2024 1658   KETONESUR 20 (A) 01/25/2024 1658   PROTEINUR 100 (A) 01/25/2024 1658   UROBILINOGEN 0.2 08/31/2011 0400   NITRITE NEGATIVE 01/25/2024 1658   LEUKOCYTESUR NEGATIVE 01/25/2024 1658   Sepsis Labs Recent Labs  Lab 01/25/24 1337 01/26/24 0352  WBC 13.9* 9.1   Microbiology Recent Results (from the past 240 hours)  Resp panel by RT-PCR (RSV, Flu A&B, Covid) Anterior Nasal Swab     Status: None   Collection Time: 01/25/24  1:30 PM   Specimen: Anterior Nasal Swab  Result Value Ref Range Status   SARS Coronavirus 2 by RT PCR NEGATIVE NEGATIVE Final    Comment: (NOTE) SARS-CoV-2 target nucleic acids are NOT DETECTED.  The SARS-CoV-2 RNA is generally detectable in upper respiratory specimens during the acute phase of infection. The lowest concentration of SARS-CoV-2 viral copies this assay can detect is 138 copies/mL. A negative result does not preclude SARS-Cov-2 infection and should not be used as the sole basis for treatment or other patient management decisions. A negative result may occur with  improper specimen collection/handling, submission of specimen other than nasopharyngeal swab, presence of viral mutation(s) within the areas targeted by this assay, and inadequate number of viral copies(<138 copies/mL). A negative result must be combined with clinical observations, patient history, and epidemiological information. The expected result is Negative.  Fact Sheet for Patients:  BloggerCourse.com  Fact  Sheet for Healthcare Providers:  SeriousBroker.it  This test is no t yet approved or cleared by the [place]  FDA and  has been authorized for detection and/or diagnosis of SARS-CoV-2 by FDA under an Emergency Use Authorization (EUA). This EUA will remain  in effect (meaning this test can be used) for the duration of the COVID-19 declaration under Section 564(b)(1) of the Act, 21 U.S.C.section 360bbb-3(b)(1), unless the authorization is terminated  or revoked sooner.       Influenza A by PCR NEGATIVE NEGATIVE Final   Influenza B by PCR NEGATIVE NEGATIVE Final  Comment: (NOTE) The Xpert Xpress SARS-CoV-2/FLU/RSV plus assay is intended as an aid in the diagnosis of influenza from Nasopharyngeal swab specimens and should not be used as a sole basis for treatment. Nasal washings and aspirates are unacceptable for Xpert Xpress SARS-CoV-2/FLU/RSV testing.  Fact Sheet for Patients: BloggerCourse.com  Fact Sheet for Healthcare Providers: SeriousBroker.it  This test is not yet approved or cleared by the [place]  FDA and has been authorized for detection and/or diagnosis of SARS-CoV-2 by FDA under an Emergency Use Authorization (EUA). This EUA will remain in effect (meaning this test can be used) for the duration of the COVID-19 declaration under Section 564(b)(1) of the Act, 21 U.S.C. section 360bbb-3(b)(1), unless the authorization is terminated or revoked.     Resp Syncytial Virus by PCR NEGATIVE NEGATIVE Final    Comment: (NOTE) Fact Sheet for Patients: BloggerCourse.com  Fact Sheet for Healthcare Providers: SeriousBroker.it  This test is not yet approved or cleared by the [place]  FDA and has been authorized for detection and/or diagnosis of SARS-CoV-2 by FDA under an Emergency Use Authorization (EUA). This EUA will remain in effect  (meaning this test can be used) for the duration of the COVID-19 declaration under Section 564(b)(1) of the Act, 21 U.S.C. section 360bbb-3(b)(1), unless the authorization is terminated or revoked.  Performed at [facility], [address], [place], [place] [address]   Culture, blood (Routine X 2) w Reflex to ID Panel     Status: None (Preliminary result)   Collection Time: 01/25/24  1:46 PM   Specimen: BLOOD LEFT FOREARM  Result Value Ref Range Status   Specimen Description   Final    BLOOD LEFT FOREARM BOTTLES DRAWN AEROBIC AND ANAEROBIC   Special Requests   Final    Blood Culture results may not be optimal due to an inadequate volume of blood received in culture bottles   Culture   Final    NO GROWTH < 24 HOURS Performed at [facility], [address] [place], [place] [address]    Report Status PENDING  Incomplete  Culture, blood (routine x 2)     Status: None (Preliminary result)   Collection Time: 01/25/24  1:47 PM   Specimen: BLOOD LEFT HAND  Result Value Ref Range Status   Specimen Description BLOOD LEFT HAND BOTTLES DRAWN AEROBIC ONLY  Final   Special Requests Blood Culture adequate volume  Final   Culture   Final    NO GROWTH < 24 HOURS Performed at [facility], [address], [place], [place] [address]    Report Status PENDING  Incomplete  MRSA Next Gen by PCR, Nasal     Status: None   Collection Time: 01/25/24  7:12 PM   Specimen: Nasal Mucosa; Nasal Swab  Result Value Ref Range Status   MRSA by PCR Next Gen NOT DETECTED NOT DETECTED Final    Comment: (NOTE) The GeneXpert MRSA Assay (FDA approved for NASAL specimens only), is one component of a comprehensive MRSA colonization surveillance program. It is not intended to diagnose MRSA infection nor to guide or monitor treatment for MRSA infections. Test performance is not FDA approved in patients less than 63 years old. Performed at [facility], [address], [place], [place] [address]      Time  coordinating discharge: 35 minutes  SIGNED:   Adron JONETTA Fairly, DO [facility] 01/26/2024, 10:10 AM  If 7PM-7AM, please contact night-coverage [email or web address]

## 2024-01-26 NOTE — Progress Notes (Signed)
   01/26/24 1038  TOC Brief Assessment  Insurance and Status Reviewed  Patient has primary care physician No (Appt scheduled. Earliest available 04/26/24)  Home environment has been reviewed Yes, home alone  Prior level of function: Independent  Prior/Current Home Services No current home services  Social Drivers of Health Review SDOH reviewed no interventions necessary  Readmission risk has been reviewed Yes  Transition of care needs no transition of care needs at this time

## 2024-01-26 NOTE — Plan of Care (Signed)
   Problem: Education: Goal: Knowledge of General Education information will improve Description: Including pain rating scale, medication(s)/side effects and non-pharmacologic comfort measures Outcome: Not Progressing   Problem: Activity: Goal: Risk for activity intolerance will decrease Outcome: Not Progressing   Problem: Nutrition: Goal: Adequate nutrition will be maintained Outcome: Not Progressing

## 2024-01-30 LAB — CULTURE, BLOOD (ROUTINE X 2)
Culture: NO GROWTH
Culture: NO GROWTH
Special Requests: ADEQUATE

## 2024-02-01 LAB — GLUCOSE, CAPILLARY: Glucose-Capillary: 168 mg/dL — ABNORMAL HIGH (ref 70–99)

## 2024-04-26 ENCOUNTER — Ambulatory Visit: Admitting: Physician Assistant
